# Patient Record
Sex: Female | Born: 1995 | Race: Black or African American | Hispanic: No | Marital: Single | Smoking: Former smoker
Health system: Southern US, Community
[De-identification: ages and names within clinical notes are randomized; demographics above are authoritative.]

## PROBLEM LIST (undated history)

## (undated) DIAGNOSIS — K219 Gastro-esophageal reflux disease without esophagitis: Secondary | ICD-10-CM

## (undated) DIAGNOSIS — R87629 Unspecified abnormal cytological findings in specimens from vagina: Secondary | ICD-10-CM

## (undated) DIAGNOSIS — J45909 Unspecified asthma, uncomplicated: Secondary | ICD-10-CM

## (undated) HISTORY — PX: NO PAST SURGERIES: SHX2092

---
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
## 2015-09-29 ENCOUNTER — Emergency Department (HOSPITAL_COMMUNITY)
Admission: EM | Admit: 2015-09-29 | Discharge: 2015-09-29 | Disposition: A | Discharge status: Home / Self Care | Payer: Self-pay | Attending: Emergency Medicine | Admitting: Emergency Medicine

## 2015-09-29 ENCOUNTER — Encounter (HOSPITAL_COMMUNITY): Payer: Self-pay | Admitting: Emergency Medicine

## 2015-09-29 DIAGNOSIS — R112 Nausea with vomiting, unspecified: Secondary | ICD-10-CM | POA: Insufficient documentation

## 2015-09-29 DIAGNOSIS — Z79899 Other long term (current) drug therapy: Secondary | ICD-10-CM | POA: Insufficient documentation

## 2015-09-29 DIAGNOSIS — R197 Diarrhea, unspecified: Secondary | ICD-10-CM | POA: Insufficient documentation

## 2015-09-29 DIAGNOSIS — Z3202 Encounter for pregnancy test, result negative: Secondary | ICD-10-CM | POA: Insufficient documentation

## 2015-09-29 DIAGNOSIS — R1084 Generalized abdominal pain: Secondary | ICD-10-CM | POA: Insufficient documentation

## 2015-09-29 LAB — I-STAT BETA HCG BLOOD, ED (MC, WL, AP ONLY): I-stat hCG, quantitative: <5 m[IU]/mL (ref <5)

## 2015-09-29 MED ORDER — ONDANSETRON HCL 4 MG/2ML IJ SOLN
4.0000 mg | Freq: Once | INTRAMUSCULAR | Status: AC | PRN
  Administered 2015-09-29: 4 mg via INTRAVENOUS
  Filled 2015-09-29: qty 2

## 2015-09-29 MED ORDER — DICYCLOMINE HCL 20 MG PO TABS: 20.0000 mg | ORAL_TABLET | Freq: Two times a day (BID) | ORAL | Status: DC

## 2015-09-29 MED ORDER — MORPHINE SULFATE (PF) 4 MG/ML IV SOLN
4.0000 mg | Freq: Once | INTRAVENOUS | Status: AC
  Administered 2015-09-29: 4 mg via INTRAVENOUS
  Filled 2015-09-29: qty 1

## 2015-09-29 MED ORDER — DIPHENHYDRAMINE HCL 50 MG/ML IJ SOLN
12.5000 mg | Freq: Once | INTRAMUSCULAR | Status: AC
  Administered 2015-09-29: 12.5 mg via INTRAVENOUS
  Filled 2015-09-29: qty 1

## 2015-09-29 MED ORDER — METOCLOPRAMIDE HCL 10 MG PO TABS: 10.0000 mg | ORAL_TABLET | Freq: Four times a day (QID) | ORAL | Status: DC

## 2015-09-29 MED ORDER — METOCLOPRAMIDE HCL 5 MG/ML IJ SOLN
10.0000 mg | Freq: Once | INTRAMUSCULAR | Status: AC
  Administered 2015-09-29: 10 mg via INTRAVENOUS
  Filled 2015-09-29: qty 2

## 2015-09-29 MED ORDER — SODIUM CHLORIDE 0.9 % IV BOLUS (SEPSIS)
1000.0000 mL | Freq: Once | INTRAVENOUS | Status: AC
  Administered 2015-09-29: 1000 mL via INTRAVENOUS

## 2015-09-29 NOTE — ED Notes (Signed)
Pt has emesis x 5 hours. Pt was given 4mg  of zofran in route. Pt has generalized abdominal pain and diarrhea.

## 2015-09-29 NOTE — Discharge Instructions (Signed)
RETURN TO THE EMERGENCY DEPARTMENT IF YOU HAVE SEVERE PAIN, HIGH FEVER, UNCONTROLLED VOMITING OR NEW CONCERN.  Nausea and Vomiting Nausea is a sick feeling that often comes before throwing up (vomiting). Vomiting is a reflex where stomach contents come out of your mouth. Vomiting can cause severe loss of body fluids (dehydration). Children and elderly adults can become dehydrated quickly, especially if they also have diarrhea. Nausea and vomiting are symptoms of a condition or disease. It is important to find the cause of your symptoms. CAUSES   Direct irritation of the stomach lining. This irritation can result from increased acid production (gastroesophageal reflux disease), infection, food poisoning, taking certain medicines (such as nonsteroidal anti-inflammatory drugs), alcohol use, or tobacco use.  Signals from the brain.These signals could be caused by a headache, heat exposure, an inner ear disturbance, increased pressure in the brain from injury, infection, a tumor, or a concussion, pain, emotional stimulus, or metabolic problems.  An obstruction in the gastrointestinal tract (bowel obstruction).  Illnesses such as diabetes, hepatitis, gallbladder problems, appendicitis, kidney problems, cancer, sepsis, atypical symptoms of a heart attack, or eating disorders.  Medical treatments such as chemotherapy and radiation.  Receiving medicine that makes you sleep (general anesthetic) during surgery. DIAGNOSIS Your caregiver may ask for tests to be done if the problems do not improve after a few days. Tests may also be done if symptoms are severe or if the reason for the nausea and vomiting is not clear. Tests may include:  Urine tests.  Blood tests.  Stool tests.  Cultures (to look for evidence of infection).  X-rays or other imaging studies. Test results can help your caregiver make decisions about treatment or the need for additional tests. TREATMENT You need to stay well hydrated.  Drink frequently but in small amounts.You may wish to drink water, sports drinks, clear broth, or eat frozen ice pops or gelatin dessert to help stay hydrated.When you eat, eating slowly may help prevent nausea.There are also some antinausea medicines that may help prevent nausea. HOME CARE INSTRUCTIONS   Take all medicine as directed by your caregiver.  If you do not have an appetite, do not force yourself to eat. However, you must continue to drink fluids.  If you have an appetite, eat a normal diet unless your caregiver tells you differently.  Eat a variety of complex carbohydrates (rice, wheat, potatoes, bread), lean meats, yogurt, fruits, and vegetables.  Avoid high-fat foods because they are more difficult to digest.  Drink enough water and fluids to keep your urine clear or pale yellow.  If you are dehydrated, ask your caregiver for specific rehydration instructions. Signs of dehydration may include:  Severe thirst.  Dry lips and mouth.  Dizziness.  Dark urine.  Decreasing urine frequency and amount.  Confusion.  Rapid breathing or pulse. SEEK IMMEDIATE MEDICAL CARE IF:   You have blood or brown flecks (like coffee grounds) in your vomit.  You have black or bloody stools.  You have a severe headache or stiff neck.  You are confused.  You have severe abdominal pain.  You have chest pain or trouble breathing.  You do not urinate at least once every 8 hours.  You develop cold or clammy skin.  You continue to vomit for longer than 24 to 48 hours.  You have a fever. MAKE SURE YOU:   Understand these instructions.  Will watch your condition.  Will get help right away if you are not doing well or get worse.  This information is not intended to replace advice given to you by your health care provider. Make sure you discuss any questions you have with your health care provider.   Document Released: 09/30/2005 Document Revised: 12/23/2011 Document  Reviewed: 02/27/2011 Elsevier Interactive Patient Education [employer] Inc[employer]

## 2015-09-29 NOTE — ED Notes (Signed)
Bed: [id number] Expected date:  Expected time:  Means of arrival:  Comments: EMS 19 yo female from home, nausea and vomiting for 5 hours, IV established, Zofran per EMS

## 2015-09-29 NOTE — ED Provider Notes (Signed)
CSN: [id number]     Arrival date & time 09/29/15  0309 History   First MD Initiated Contact with Patient 09/29/15 [phone]     Chief Complaint  Patient presents with  . Emesis     (Consider location/radiation/quality/duration/timing/severity/associated sxs/prior Treatment) Patient is a 19 y.o. female presenting with vomiting. The history is provided by the patient. No language interpreter was used.  Emesis Severity:  Moderate Duration:  5 hours Timing:  Constant Number of daily episodes:  TNTC Chronicity:  New Context: not post-tussive   Relieved by:  Nothing Associated symptoms: abdominal pain and diarrhea   Associated symptoms: no chills and no myalgias   Associated symptoms comment:  Patient started having nausea with vomiting and generalized abdominal pain earlier this evening while at work. No fever. She has had 2-3 loose stools. No hematemesis or bloody stool. No cough, dysuria, SOB. Patient works at [employer] and feels she has been exposed to others with similar symptoms.   History reviewed. No pertinent past medical history. History reviewed. No pertinent past surgical history. No family history on file. Social History  Substance Use Topics  . Smoking status: None  . Smokeless tobacco: None  . Alcohol Use: None   OB History    No data available     Review of Systems  Constitutional: Negative for fever and chills.  Respiratory: Negative.  Negative for shortness of breath.   Cardiovascular: Negative.  Negative for chest pain.  Gastrointestinal: Positive for nausea, vomiting, abdominal pain and diarrhea.  Genitourinary: Negative.   Musculoskeletal: Negative.  Negative for myalgias.  Skin: Negative.   Neurological: Negative.       Allergies  Review of patient's allergies indicates no known allergies.  Home Medications   Prior to Admission medications   Medication Sig Start Date End Date Taking? Authorizing Provider  budesonide-formoterol (SYMBICORT) 160-4.5  MCG/ACT inhaler Inhale 2 puffs into the lungs 2 (two) times daily.   Yes Historical Provider, MD  loperamide (IMODIUM) 2 MG capsule Take 2 mg by mouth as needed for diarrhea or loose stools.   Yes Historical Provider, MD   BP 125/77 mmHg  Pulse 75  Temp(Src) 98.3 F (36.8 C) (Oral)  Resp 16  Ht 5\' 8"  (1.727 m)  Wt 65.772 kg  BMI 22.05 kg/m2  SpO2 100%  LMP 08/26/2015 (Approximate) Physical Exam  Constitutional: She is oriented to person, place, and time. She appears well-developed and well-nourished.  HENT:  Head: Normocephalic.  Neck: Normal range of motion. Neck supple.  Cardiovascular: Normal rate and regular rhythm.   Pulmonary/Chest: Effort normal and breath sounds normal. She has no wheezes. She has no rales.  Abdominal: Soft. Bowel sounds are normal. There is tenderness. There is no rebound and no guarding.  Generalized tenderness to soft abdomen.  Musculoskeletal: Normal range of motion.  Neurological: She is alert and oriented to person, place, and time.  Skin: Skin is warm and dry.  Psychiatric: She has a normal mood and affect.    ED Course  Procedures (including critical care time) Labs Review Labs Reviewed - No data to display  Imaging Review No results found. I have personally reviewed and evaluated these images and lab results as part of my medical decision-making.   EKG Interpretation None     Results for orders placed or performed during the hospital encounter of 09/29/15  I-Stat beta hCG blood, ED  Result Value Ref Range   I-stat hCG, quantitative <5.0 <5 mIU/mL   Comment 3  MDM   Final diagnoses:  None    1. Nausea and vomiting  Patient has been exposed to similar illness of N, V in work community. She is healthy otherwise, with normal VS and short duration of symptoms. Abdominal pain is diffuse, better with medications. Vomiting controlled, tolerating PO fluids. Return precautions discussed. She is felt stable for discharge home.      Elpidio Anis, PA-C 09/29/15 2258  Derwood Kaplan, MD 09/30/15 2209

## 2016-01-31 ENCOUNTER — Emergency Department (HOSPITAL_COMMUNITY)
Admission: EM | Admit: 2016-01-31 | Discharge: 2016-01-31 | Disposition: A | Discharge status: Home / Self Care | Payer: Self-pay | Attending: Emergency Medicine | Admitting: Emergency Medicine

## 2016-01-31 DIAGNOSIS — Z3202 Encounter for pregnancy test, result negative: Secondary | ICD-10-CM | POA: Insufficient documentation

## 2016-01-31 DIAGNOSIS — K2920 Alcoholic gastritis without bleeding: Secondary | ICD-10-CM | POA: Insufficient documentation

## 2016-01-31 DIAGNOSIS — Z79899 Other long term (current) drug therapy: Secondary | ICD-10-CM | POA: Insufficient documentation

## 2016-01-31 LAB — CBC WITH DIFFERENTIAL/PLATELET
Basophils Absolute: 0 10*3/uL (ref 0.0–0.1)
Basophils Relative: 0 %
Eosinophils Absolute: 0 10*3/uL (ref 0.0–0.7)
Eosinophils Relative: 1 %
HEMATOCRIT: 38.6 % (ref 36.0–46.0)
HEMOGLOBIN: 13.4 g/dL (ref 12.0–15.0)
LYMPHS ABS: 1.5 10*3/uL (ref 0.7–4.0)
LYMPHS PCT: 28 %
MCH: 29.8 pg (ref 26.0–34.0)
MCHC: 34.7 g/dL (ref 30.0–36.0)
MCV: 86 fL (ref 78.0–100.0)
MONOS PCT: 8 %
Monocytes Absolute: 0.4 10*3/uL (ref 0.1–1.0)
NEUTROS ABS: 3.4 10*3/uL (ref 1.7–7.7)
NEUTROS PCT: 63 %
Platelets: 311 10*3/uL (ref 150–400)
RBC: 4.49 MIL/uL (ref 3.87–5.11)
RDW: 13.3 % (ref 11.5–15.5)
WBC: 5.4 10*3/uL (ref 4.0–10.5)

## 2016-01-31 LAB — COMPREHENSIVE METABOLIC PANEL
ALK PHOS: 55 U/L (ref 38–126)
ALT: 68 U/L — ABNORMAL HIGH (ref 14–54)
ANION GAP: 15 (ref 5–15)
AST: 41 U/L (ref 15–41)
Albumin: 4.8 g/dL (ref 3.5–5.0)
BILIRUBIN TOTAL: 0.4 mg/dL (ref 0.3–1.2)
BUN: 13 mg/dL (ref 6–20)
CALCIUM: 9.8 mg/dL (ref 8.9–10.3)
CO2: 18 mmol/L — ABNORMAL LOW (ref 22–32)
CREATININE: 0.66 mg/dL (ref 0.44–1.00)
Chloride: 109 mmol/L (ref 101–111)
GFR calc non Af Amer: >60 mL/min (ref >60)
Glucose, Bld: 114 mg/dL — ABNORMAL HIGH (ref 65–99)
Potassium: 3.5 mmol/L (ref 3.5–5.1)
Sodium: 142 mmol/L (ref 135–145)
TOTAL PROTEIN: 8.3 g/dL — AB (ref 6.5–8.1)

## 2016-01-31 LAB — URINALYSIS, ROUTINE W REFLEX MICROSCOPIC
Bilirubin Urine: NEGATIVE
GLUCOSE, UA: NEGATIVE mg/dL
KETONES UR: NEGATIVE mg/dL
Leukocytes, UA: NEGATIVE
Nitrite: NEGATIVE
PH: 7 (ref 5.0–8.0)
PROTEIN: NEGATIVE mg/dL
Specific Gravity, Urine: 1.022 (ref 1.005–1.030)

## 2016-01-31 LAB — I-STAT BETA HCG BLOOD, ED (MC, WL, AP ONLY)

## 2016-01-31 LAB — URINE MICROSCOPIC-ADD ON
BACTERIA UA: NONE SEEN
WBC, UA: NONE SEEN WBC/hpf (ref 0–5)

## 2016-01-31 LAB — LIPASE, BLOOD: Lipase: 19 U/L (ref 11–51)

## 2016-01-31 MED ORDER — SODIUM CHLORIDE 0.9 % IV SOLN: INTRAVENOUS | Status: DC

## 2016-01-31 MED ORDER — SODIUM CHLORIDE 0.9 % IV BOLUS (SEPSIS)
2000.0000 mL | Freq: Once | INTRAVENOUS | Status: AC
  Administered 2016-01-31: 2000 mL via INTRAVENOUS

## 2016-01-31 MED ORDER — SUCRALFATE 1 G PO TABS: 1.0000 g | ORAL_TABLET | Freq: Four times a day (QID) | ORAL | Status: DC

## 2016-01-31 MED ORDER — PANTOPRAZOLE SODIUM 40 MG IV SOLR
40.0000 mg | Freq: Once | INTRAVENOUS | Status: AC
  Administered 2016-01-31: 40 mg via INTRAVENOUS
  Filled 2016-01-31: qty 40

## 2016-01-31 MED ORDER — MORPHINE SULFATE (PF) 4 MG/ML IV SOLN
4.0000 mg | Freq: Once | INTRAVENOUS | Status: AC
  Administered 2016-01-31: 4 mg via INTRAVENOUS
  Filled 2016-01-31: qty 1

## 2016-01-31 MED ORDER — ONDANSETRON HCL 4 MG/2ML IJ SOLN
4.0000 mg | Freq: Once | INTRAMUSCULAR | Status: AC
  Administered 2016-01-31: 4 mg via INTRAVENOUS
  Filled 2016-01-31: qty 2

## 2016-01-31 MED ORDER — FAMOTIDINE 20 MG PO TABS: 20.0000 mg | ORAL_TABLET | Freq: Two times a day (BID) | ORAL | Status: DC

## 2016-01-31 NOTE — Discharge Instructions (Signed)

## 2016-01-31 NOTE — ED Notes (Signed)
Bed: AV40WA10 Expected date:  Expected time:  Means of arrival:  Comments: Ems-emesis

## 2016-01-31 NOTE — ED Notes (Signed)
Pt made aware of need for urine specimen. Pt states she is unable to walk at this time.

## 2016-01-31 NOTE — ED Notes (Signed)
Patient drank several shots of alcohol last night. She is not sure how much she had, but she has been having nausea and emesis every since. Patient states she usually feels better after she vomits, but she has continued to vomit.

## 2016-01-31 NOTE — ED Notes (Signed)
MD at bedside. 

## 2016-01-31 NOTE — ED Provider Notes (Signed)
CSN: [id number]     Arrival date & time 01/31/16  1116 History   First MD Initiated Contact with Patient 01/31/16 1135     Chief Complaint  Patient presents with  . Emesis  . Abdominal Pain     (Consider location/radiation/quality/duration/timing/severity/associated sxs/prior Treatment) Patient is a 20 y.o. female presenting with vomiting and abdominal pain. The history is provided by the patient.  Emesis Severity:  Moderate Duration:  12 hours Timing:  Constant Quality:  Stomach contents How soon after eating does vomiting occur:  1 minute Progression:  Worsening Chronicity:  New Recent urination:  Normal Context: not self-induced   Relieved by:  Nothing Worsened by:  Nothing tried Ineffective treatments:  None tried Associated symptoms: abdominal pain   Associated symptoms: no fever   Risk factors: alcohol use   Risk factors: not pregnant now and no prior abdominal surgery   Abdominal Pain Associated symptoms: vomiting     No past medical history on file. No past surgical history on file. No family history on file. Social History  Substance Use Topics  . Smoking status: Not on file  . Smokeless tobacco: Not on file  . Alcohol Use: Not on file   OB History    No data available     Review of Systems  Gastrointestinal: Positive for vomiting and abdominal pain.  All other systems reviewed and are negative.     Allergies  Review of patient's allergies indicates no known allergies.  Home Medications   Prior to Admission medications   Medication Sig Start Date End Date Taking? Authorizing Provider  budesonide-formoterol (SYMBICORT) 160-4.5 MCG/ACT inhaler Inhale 2 puffs into the lungs 2 (two) times daily.    Historical Provider, MD  dicyclomine (BENTYL) 20 MG tablet Take 1 tablet (20 mg total) by mouth 2 (two) times daily. 09/29/15   Elpidio Anis, PA-C  loperamide (IMODIUM) 2 MG capsule Take 2 mg by mouth as needed for diarrhea or loose stools.    Historical  Provider, MD  metoCLOPramide (REGLAN) 10 MG tablet Take 1 tablet (10 mg total) by mouth every 6 (six) hours. 09/29/15   Elpidio Anis, PA-C   There were no vitals taken for this visit. Physical Exam  Constitutional: She is oriented to person, place, and time. She appears well-developed and well-nourished.  Non-toxic appearance. No distress.  HENT:  Head: Normocephalic and atraumatic.  Eyes: Conjunctivae, EOM and lids are normal. Pupils are equal, round, and reactive to light.  Neck: Normal range of motion. Neck supple. No tracheal deviation present. No thyroid mass present.  Cardiovascular: Normal rate, regular rhythm and normal heart sounds.  Exam reveals no gallop.   No murmur heard. Pulmonary/Chest: Effort normal and breath sounds normal. No stridor. No respiratory distress. She has no decreased breath sounds. She has no wheezes. She has no rhonchi. She has no rales.  Abdominal: Soft. Normal appearance and bowel sounds are normal. She exhibits no distension. There is generalized tenderness. There is no rigidity, no rebound, no guarding and no CVA tenderness.  Musculoskeletal: Normal range of motion. She exhibits no edema or tenderness.  Neurological: She is alert and oriented to person, place, and time. She has normal strength. No cranial nerve deficit or sensory deficit. GCS eye subscore is 4. GCS verbal subscore is 5. GCS motor subscore is 6.  Skin: Skin is warm and dry. No abrasion and no rash noted.  Psychiatric: She has a normal mood and affect. Her speech is normal and behavior is normal.  Nursing note and vitals reviewed.   ED Course  Procedures (including critical care time) Labs Review Labs Reviewed  URINE CULTURE  CBC WITH DIFFERENTIAL/PLATELET  COMPREHENSIVE METABOLIC PANEL  LIPASE, BLOOD  URINALYSIS, ROUTINE W REFLEX MICROSCOPIC (NOT AT [facility])  I-STAT BETA HCG BLOOD, ED ([facility], [facility], [facility] ONLY)    Imaging Review No results found. I have personally reviewed and evaluated  these images and lab results as part of my medical decision-making.   EKG Interpretation None      MDM   Final diagnoses:  None    Patient is that she consumed copious amount of alcohol on empty stomach. Treated with medications and feels better. Stable for discharge and encouraged to cease drinking alcohol    Lorre NickAnthony Kinzley Savell, MD 01/31/16 1347

## 2016-02-02 LAB — URINE CULTURE

## 2017-12-25 ENCOUNTER — Encounter (HOSPITAL_COMMUNITY): Payer: Self-pay | Admitting: Emergency Medicine

## 2017-12-25 ENCOUNTER — Other Ambulatory Visit: Payer: Self-pay

## 2017-12-25 ENCOUNTER — Emergency Department (HOSPITAL_COMMUNITY)
Admission: EM | Admit: 2017-12-25 | Discharge: 2017-12-25 | Disposition: A | Discharge status: Home / Self Care | Payer: PRIVATE HEALTH INSURANCE | Attending: Emergency Medicine | Admitting: Emergency Medicine

## 2017-12-25 DIAGNOSIS — R112 Nausea with vomiting, unspecified: Secondary | ICD-10-CM | POA: Insufficient documentation

## 2017-12-25 DIAGNOSIS — Z5321 Procedure and treatment not carried out due to patient leaving prior to being seen by health care provider: Secondary | ICD-10-CM | POA: Insufficient documentation

## 2017-12-25 HISTORY — DX: Unspecified asthma, uncomplicated: J45.909

## 2017-12-25 LAB — URINALYSIS, ROUTINE W REFLEX MICROSCOPIC
BILIRUBIN URINE: NEGATIVE
Bacteria, UA: NONE SEEN
GLUCOSE, UA: NEGATIVE mg/dL
HGB URINE DIPSTICK: NEGATIVE
KETONES UR: 80 mg/dL — AB
LEUKOCYTES UA: NEGATIVE
Nitrite: NEGATIVE
Protein, ur: 30 mg/dL — AB
Specific Gravity, Urine: 1.025 (ref 1.005–1.030)
pH: 9 — ABNORMAL HIGH (ref 5.0–8.0)

## 2017-12-25 LAB — POC URINE PREG, ED: PREG TEST UR: NEGATIVE

## 2017-12-25 NOTE — ED Notes (Signed)
Called Pt in lobby no response in lobby x2. 

## 2017-12-25 NOTE — ED Notes (Signed)
PT called to be roomed no response in lobby x1.

## 2017-12-25 NOTE — ED Triage Notes (Signed)
Pt reports recurrent abdominal cramping, intermittent nausea, vomiting x 2 over last 5 days. Pt reports severe cramping every month with her menstrual cycle. Pt does not have PCP. Received BCP from health department one year ago.

## 2017-12-25 NOTE — ED Notes (Signed)
Called pt in lobby to be roomed, no response in lobby x3.

## 2019-02-26 ENCOUNTER — Encounter (HOSPITAL_COMMUNITY): Payer: Self-pay | Admitting: Emergency Medicine

## 2019-02-26 ENCOUNTER — Emergency Department (HOSPITAL_COMMUNITY)
Admission: EM | Admit: 2019-02-26 | Discharge: 2019-02-27 | Disposition: A | Discharge status: Home / Self Care | Payer: PRIVATE HEALTH INSURANCE | Attending: Emergency Medicine | Admitting: Emergency Medicine

## 2019-02-26 ENCOUNTER — Other Ambulatory Visit: Payer: Self-pay

## 2019-02-26 DIAGNOSIS — J45909 Unspecified asthma, uncomplicated: Secondary | ICD-10-CM | POA: Insufficient documentation

## 2019-02-26 DIAGNOSIS — N946 Dysmenorrhea, unspecified: Secondary | ICD-10-CM | POA: Insufficient documentation

## 2019-02-26 NOTE — ED Triage Notes (Signed)
C/o vomiting x 4 hours and lower abd cramping that she believes is related to her menstrual cycle.  LMP started yesterday.  Denies diarrhea.

## 2019-02-26 NOTE — ED Notes (Signed)
Pt unable to void at this time. 

## 2019-02-26 NOTE — ED Notes (Signed)
Pt's dad, Jalyse Meersman- [phone]

## 2019-02-27 LAB — I-STAT BETA HCG BLOOD, ED (MC, WL, AP ONLY): I-stat hCG, quantitative: <5 m[IU]/mL (ref <5)

## 2019-02-27 LAB — COMPREHENSIVE METABOLIC PANEL
ALT: 14 U/L (ref 0–44)
AST: 17 U/L (ref 15–41)
Albumin: 4.2 g/dL (ref 3.5–5.0)
Alkaline Phosphatase: 51 U/L (ref 38–126)
Anion gap: 11 (ref 5–15)
BUN: 11 mg/dL (ref 6–20)
CO2: 20 mmol/L — ABNORMAL LOW (ref 22–32)
Calcium: 9.4 mg/dL (ref 8.9–10.3)
Chloride: 107 mmol/L (ref 98–111)
Creatinine, Ser: 0.7 mg/dL (ref 0.44–1.00)
GFR calc Af Amer: >60 mL/min (ref >60)
GFR calc non Af Amer: >60 mL/min (ref >60)
Glucose, Bld: 118 mg/dL — ABNORMAL HIGH (ref 70–99)
Potassium: 3.5 mmol/L (ref 3.5–5.1)
Sodium: 138 mmol/L (ref 135–145)
Total Bilirubin: 0.5 mg/dL (ref 0.3–1.2)
Total Protein: 6.9 g/dL (ref 6.5–8.1)

## 2019-02-27 LAB — URINALYSIS, ROUTINE W REFLEX MICROSCOPIC
Bacteria, UA: NONE SEEN
Bilirubin Urine: NEGATIVE
Glucose, UA: NEGATIVE mg/dL
Ketones, ur: 20 mg/dL — AB
Leukocytes,Ua: NEGATIVE
Nitrite: NEGATIVE
Protein, ur: NEGATIVE mg/dL
Specific Gravity, Urine: 1.026 (ref 1.005–1.030)
pH: 7 (ref 5.0–8.0)

## 2019-02-27 LAB — CBC
HCT: 38.6 % (ref 36.0–46.0)
Hemoglobin: 13.2 g/dL (ref 12.0–15.0)
MCH: 30.8 pg (ref 26.0–34.0)
MCHC: 34.2 g/dL (ref 30.0–36.0)
MCV: 90.2 fL (ref 80.0–100.0)
Platelets: 248 10*3/uL (ref 150–400)
RBC: 4.28 MIL/uL (ref 3.87–5.11)
RDW: 13.3 % (ref 11.5–15.5)
WBC: 8.8 10*3/uL (ref 4.0–10.5)
nRBC: 0 % (ref 0.0–0.2)

## 2019-02-27 LAB — LIPASE, BLOOD: Lipase: 25 U/L (ref 11–51)

## 2019-02-27 MED ORDER — PROMETHAZINE HCL 25 MG PO TABS: 25.0000 mg | ORAL_TABLET | Freq: Four times a day (QID) | ORAL | 0 refills | Status: DC | PRN

## 2019-02-27 MED ORDER — NAPROXEN 500 MG PO TABS: 500.0000 mg | ORAL_TABLET | Freq: Two times a day (BID) | ORAL | 0 refills | Status: DC | PRN

## 2019-02-27 MED ORDER — SODIUM CHLORIDE 0.9 % IV BOLUS
1000.0000 mL | Freq: Once | INTRAVENOUS | Status: AC
  Administered 2019-02-27: 02:00:00 1000 mL via INTRAVENOUS

## 2019-02-27 MED ORDER — ONDANSETRON HCL 4 MG/2ML IJ SOLN
4.0000 mg | Freq: Once | INTRAMUSCULAR | Status: AC
  Administered 2019-02-27: 4 mg via INTRAVENOUS
  Filled 2019-02-27: qty 2

## 2019-02-27 MED ORDER — PROMETHAZINE HCL 25 MG/ML IJ SOLN: 12.5000 mg | Freq: Once | INTRAMUSCULAR | Status: DC | PRN

## 2019-02-27 MED ORDER — KETOROLAC TROMETHAMINE 30 MG/ML IJ SOLN
30.0000 mg | Freq: Once | INTRAMUSCULAR | Status: AC
  Administered 2019-02-27: 02:00:00 30 mg via INTRAVENOUS
  Filled 2019-02-27: qty 1

## 2019-02-27 NOTE — ED Provider Notes (Signed)
[facility] Provider Note   CSN: [id number] Arrival date & time: 02/26/19  2341    History   Chief Complaint Chief Complaint  Patient presents with  . Emesis  . Abdominal Pain    HPI Katrina Cain is a 23 y.o. female.    23 year old G0P0 female presents to the emergency department for evaluation of dysmenorrhea.  She reports a history of bad abdominal cramps associated with the first and second day of her menses.  Her menstrual cycle began yesterday and she has been having ongoing cramping.  She does not take any medication for her symptoms because it has never helped her in the past.  She denies any abnormal vaginal discharge or abnormal flow to her menses.  She has nausea and vomiting on occasion with her menstrual cramping, but her vomiting became more persistent and unrelieved tonight prompting her visit to the ED.  She had not had any fevers, diarrhea, history of abdominal surgeries.  The history is provided by the patient. No language interpreter was used.  Emesis  Associated symptoms: abdominal pain   Abdominal Pain  Associated symptoms: vomiting     Past Medical History:  Diagnosis Date  . Asthma     There are no active problems to display for this patient.   History reviewed. No pertinent surgical history.   OB History   No obstetric history on file.      Home Medications    Prior to Admission medications   Medication Sig Start Date End Date Taking? Authorizing Provider  naproxen (NAPROSYN) 500 MG tablet Take 1 tablet (500 mg total) by mouth every 12 (twelve) hours as needed for mild pain or moderate pain. 02/27/19   Antony MaduraHumes, Darin Redmann, PA-C  promethazine (PHENERGAN) 25 MG tablet Take 1 tablet (25 mg total) by mouth every 6 (six) hours as needed for nausea or vomiting. 02/27/19   Antony MaduraHumes, Cornell Bourbon, PA-C    Family History Family History  Problem Relation Age of Onset  . Diabetes Mother   . Hypertension Mother   . Cancer  Father     Social History Social History   Tobacco Use  . Smoking status: Never Smoker  . Smokeless tobacco: Never Used  Substance Use Topics  . Alcohol use: No    Frequency: Never  . Drug use: Not Currently     Allergies   Patient has no known allergies.   Review of Systems Review of Systems  Gastrointestinal: Positive for abdominal pain and vomiting.  Ten systems reviewed and are negative for acute change, except as noted in the HPI.    Physical Exam Updated Vital Signs BP 106/70   Pulse 80   Temp 97.9 F (36.6 C) (Oral)   Resp (!) 30   LMP 02/25/2019   SpO2 100%   Physical Exam Vitals signs and nursing note reviewed.  Constitutional:      General: She is not in acute distress.    Appearance: She is well-developed. She is not diaphoretic.     Comments: Tearful   HENT:     Head: Normocephalic and atraumatic.  Eyes:     General: No scleral icterus.    Conjunctiva/sclera: Conjunctivae normal.  Neck:     Musculoskeletal: Normal range of motion.  Pulmonary:     Effort: Pulmonary effort is normal. No respiratory distress.     Comments: Respirations even and unlabored Abdominal:     Palpations: There is no mass.     Tenderness: There  is abdominal tenderness. There is guarding (voluntary).     Comments: Abdomen nondistended and generally tender.  Tenderness is subjectively worse in bilateral lower quadrants with voluntary guarding.  No palpable masses or peritoneal signs.  Musculoskeletal: Normal range of motion.  Skin:    General: Skin is warm and dry.     Coloration: Skin is not pale.     Findings: No erythema or rash.  Neurological:     Mental Status: She is alert and oriented to person, place, and time.  Psychiatric:        Behavior: Behavior normal.      ED Treatments / Results  Labs (all labs ordered are listed, but only abnormal results are displayed) Labs Reviewed  COMPREHENSIVE METABOLIC PANEL - Abnormal; Notable for the following  components:      Result Value   CO2 20 (*)    Glucose, Bld 118 (*)    All other components within normal limits  URINALYSIS, ROUTINE W REFLEX MICROSCOPIC - Abnormal; Notable for the following components:   Hgb urine dipstick SMALL (*)    Ketones, ur 20 (*)    All other components within normal limits  LIPASE, BLOOD  CBC  I-STAT BETA HCG BLOOD, ED ([facility], [facility], [facility] ONLY)    EKG None  Radiology No results found.  Procedures Procedures (including critical care time)  Medications Ordered in ED Medications  promethazine (PHENERGAN) injection 12.5 mg (has no administration in time range)  ketorolac (TORADOL) 30 MG/ML injection 30 mg (30 mg Intravenous Given 02/27/19 0202)  ondansetron (ZOFRAN) injection 4 mg (4 mg Intravenous Given 02/27/19 0202)  sodium chloride 0.9 % bolus 1,000 mL (0 mLs Intravenous Stopped 02/27/19 0328)    3:29 AM Patient sleeping on repeat assessment. Upon waking, pain has subsided; currently reporting no pain. Will PO challenge. If tolerating fluids plan for d/c.  4:10 AM Patient reassessed.  Continues to have no pain.  She is tolerating oral fluids without vomiting.   Initial Impression / Assessment and Plan / ED Course  I have reviewed the triage vital signs and the nursing notes.  Pertinent labs & imaging results that were available during my care of the patient were reviewed by me and considered in my medical decision making (see chart for details).        23 year old female presents to the emergency department for abdominal cramping, nausea, vomiting.  She has a history of similar symptoms associated with her menstrual cycle.  Her menses began yesterday.  No medications taken prior to arrival.  Noted to have significant symptomatic improvement following Toradol, Zofran, IV fluids.  Symptoms consistent with uncomplicated dysmenorrhea.  She has been advised to follow-up with her primary care doctor/OB/GYN.  Will discharge with antiemetics and prescription  for naproxen. Return precautions discussed and provided. Patient discharged in stable condition with no unaddressed concerns.   Final Clinical Impressions(s) / ED Diagnoses   Final diagnoses:  Dysmenorrhea    ED Discharge Orders         Ordered    promethazine (PHENERGAN) 25 MG tablet  Every 6 hours PRN     02/27/19 0410    naproxen (NAPROSYN) 500 MG tablet  Every 12 hours PRN     02/27/19 0410           Antony Madura, PA-C 02/27/19 0434    Nira Conn, MD 02/27/19 [phone]

## 2019-02-27 NOTE — ED Notes (Signed)
Pt tolerating fluids well when taking small sips. No n/v experienced.

## 2019-02-27 NOTE — ED Notes (Signed)
Patient states she is unable to void at this time.  

## 2020-03-30 ENCOUNTER — Encounter (HOSPITAL_COMMUNITY): Payer: Self-pay

## 2020-03-30 ENCOUNTER — Other Ambulatory Visit: Payer: Self-pay

## 2020-03-30 ENCOUNTER — Emergency Department (HOSPITAL_COMMUNITY)
Admission: EM | Admit: 2020-03-30 | Discharge: 2020-03-30 | Disposition: A | Discharge status: Home / Self Care | Payer: PRIVATE HEALTH INSURANCE | Attending: Emergency Medicine | Admitting: Emergency Medicine

## 2020-03-30 DIAGNOSIS — N946 Dysmenorrhea, unspecified: Secondary | ICD-10-CM | POA: Insufficient documentation

## 2020-03-30 DIAGNOSIS — Z79899 Other long term (current) drug therapy: Secondary | ICD-10-CM | POA: Insufficient documentation

## 2020-03-30 DIAGNOSIS — R112 Nausea with vomiting, unspecified: Secondary | ICD-10-CM

## 2020-03-30 DIAGNOSIS — J45909 Unspecified asthma, uncomplicated: Secondary | ICD-10-CM | POA: Insufficient documentation

## 2020-03-30 LAB — COMPREHENSIVE METABOLIC PANEL
ALT: 13 U/L (ref 0–44)
AST: 17 U/L (ref 15–41)
Albumin: 4.3 g/dL (ref 3.5–5.0)
Alkaline Phosphatase: 46 U/L (ref 38–126)
Anion gap: 6 (ref 5–15)
BUN: 15 mg/dL (ref 6–20)
CO2: 24 mmol/L (ref 22–32)
Calcium: 9 mg/dL (ref 8.9–10.3)
Chloride: 109 mmol/L (ref 98–111)
Creatinine, Ser: 0.67 mg/dL (ref 0.44–1.00)
GFR calc Af Amer: >60 mL/min (ref >60)
GFR calc non Af Amer: >60 mL/min (ref >60)
Glucose, Bld: 87 mg/dL (ref 70–99)
Potassium: 4.1 mmol/L (ref 3.5–5.1)
Sodium: 139 mmol/L (ref 135–145)
Total Bilirubin: 0.5 mg/dL (ref 0.3–1.2)
Total Protein: 7.3 g/dL (ref 6.5–8.1)

## 2020-03-30 LAB — URINALYSIS, ROUTINE W REFLEX MICROSCOPIC
Bilirubin Urine: NEGATIVE
Glucose, UA: NEGATIVE mg/dL
Ketones, ur: NEGATIVE mg/dL
Leukocytes,Ua: NEGATIVE
Nitrite: NEGATIVE
Protein, ur: NEGATIVE mg/dL
Specific Gravity, Urine: 1.034 — ABNORMAL HIGH (ref 1.005–1.030)
pH: 6 (ref 5.0–8.0)

## 2020-03-30 LAB — CBC
HCT: 39.4 % (ref 36.0–46.0)
Hemoglobin: 13.1 g/dL (ref 12.0–15.0)
MCH: 31.1 pg (ref 26.0–34.0)
MCHC: 33.2 g/dL (ref 30.0–36.0)
MCV: 93.6 fL (ref 80.0–100.0)
Platelets: 272 10*3/uL (ref 150–400)
RBC: 4.21 MIL/uL (ref 3.87–5.11)
RDW: 13.7 % (ref 11.5–15.5)
WBC: 5.4 10*3/uL (ref 4.0–10.5)
nRBC: 0 % (ref 0.0–0.2)

## 2020-03-30 LAB — I-STAT BETA HCG BLOOD, ED (MC, WL, AP ONLY): I-stat hCG, quantitative: <5 m[IU]/mL (ref <5)

## 2020-03-30 LAB — LIPASE, BLOOD: Lipase: 26 U/L (ref 11–51)

## 2020-03-30 MED ORDER — KETOROLAC TROMETHAMINE 30 MG/ML IJ SOLN
30.0000 mg | Freq: Once | INTRAMUSCULAR | Status: AC
  Administered 2020-03-30: 30 mg via INTRAMUSCULAR
  Filled 2020-03-30: qty 1

## 2020-03-30 MED ORDER — ONDANSETRON 4 MG PO TBDP
4.0000 mg | ORAL_TABLET | Freq: Once | ORAL | Status: AC
  Administered 2020-03-30: 4 mg via ORAL
  Filled 2020-03-30: qty 1

## 2020-03-30 MED ORDER — ONDANSETRON 4 MG PO TBDP: 4.0000 mg | ORAL_TABLET | Freq: Three times a day (TID) | ORAL | 0 refills | Status: DC | PRN

## 2020-03-30 MED ORDER — NAPROXEN 500 MG PO TABS: 500.0000 mg | ORAL_TABLET | Freq: Two times a day (BID) | ORAL | 0 refills | Status: DC | PRN

## 2020-03-30 NOTE — ED Notes (Signed)
Beta result <5.0 IU/L

## 2020-03-30 NOTE — ED Triage Notes (Signed)
Pt states irregular menstrual cycle. Presents to ER for uncontrolled pains and vomiting. States vomiting all day.

## 2020-03-30 NOTE — ED Provider Notes (Signed)
[facility]-EMERGENCY DEPT Provider Note   CSN: [id number] Arrival date & time: 03/30/20  1929     History Chief Complaint  Patient presents with  . Emesis    Katrina Cain is a 24 y.o. female.  Patient presents to the emergency department with a chief complaint of menstrual cramps.  She states that she has had her typical menstrual pain that started yesterday with bleeding that is normal.  She states that today she has also had persistent vomiting.  She states that this is not unusual for her.  She states that she usually follows up with the health department for the symptoms.  She has not been seen by an OB/GYN.  She states that since arriving in the emergency department her vomiting has stopped without treatment.  She denies any fevers or chills.   The history is provided by the patient. No language interpreter was used.       Past Medical History:  Diagnosis Date  . Asthma     There are no problems to display for this patient.   History reviewed. No pertinent surgical history.   OB History   No obstetric history on file.     Family History  Problem Relation Age of Onset  . Diabetes Mother   . Hypertension Mother   . Cancer Father     Social History   Tobacco Use  . Smoking status: Never Smoker  . Smokeless tobacco: Never Used  Substance Use Topics  . Alcohol use: No  . Drug use: Not Currently    Home Medications Prior to Admission medications   Medication Sig Start Date End Date Taking? Authorizing Provider  naproxen (NAPROSYN) 500 MG tablet Take 1 tablet (500 mg total) by mouth every 12 (twelve) hours as needed for mild pain or moderate pain. 03/30/20   Roxy Horseman, PA-C  ondansetron (ZOFRAN ODT) 4 MG disintegrating tablet Take 1 tablet (4 mg total) by mouth every 8 (eight) hours as needed for nausea or vomiting. 03/30/20   Roxy Horseman, PA-C  promethazine (PHENERGAN) 25 MG tablet Take 1 tablet (25 mg total) by mouth  every 6 (six) hours as needed for nausea or vomiting. 02/27/19 03/30/20  Antony Madura, PA-C    Allergies    Patient has no known allergies.  Review of Systems   Review of Systems  All other systems reviewed and are negative.   Physical Exam Updated Vital Signs BP 115/77 (BP Location: Left Arm)   Pulse 68   Temp 97.7 F (36.5 C) (Oral)   Resp 17   Ht 5\' 7"  (1.702 m)   Wt 70.3 kg   SpO2 100%   BMI 24.28 kg/m   Physical Exam Vitals and nursing note reviewed.  Constitutional:      General: She is not in acute distress.    Appearance: She is well-developed.  HENT:     Head: Normocephalic and atraumatic.  Eyes:     Conjunctiva/sclera: Conjunctivae normal.  Cardiovascular:     Rate and Rhythm: Normal rate and regular rhythm.     Heart sounds: No murmur heard.   Pulmonary:     Effort: Pulmonary effort is normal. No respiratory distress.     Breath sounds: Normal breath sounds.  Abdominal:     Palpations: Abdomen is soft.     Tenderness: There is no abdominal tenderness.     Comments: Mild lower abdominal discomfort, no focal tenderness  Musculoskeletal:     Cervical back: Neck supple.  Skin:    General: Skin is warm and dry.  Neurological:     Mental Status: She is alert.  Psychiatric:        Mood and Affect: Mood normal.        Behavior: Behavior normal.     ED Results / Procedures / Treatments   Labs (all labs ordered are listed, but only abnormal results are displayed) Labs Reviewed  URINALYSIS, ROUTINE W REFLEX MICROSCOPIC - Abnormal; Notable for the following components:      Result Value   Specific Gravity, Urine 1.034 (*)    Hgb urine dipstick SMALL (*)    Bacteria, UA RARE (*)    All other components within normal limits  LIPASE, BLOOD  COMPREHENSIVE METABOLIC PANEL  CBC  I-STAT BETA HCG BLOOD, ED ([facility], [facility], [facility] ONLY)    EKG None  Radiology No results found.  Procedures Procedures (including critical care time)  Medications Ordered in  ED Medications  ondansetron (ZOFRAN-ODT) disintegrating tablet 4 mg (has no administration in time range)  ketorolac (TORADOL) 30 MG/ML injection 30 mg (has no administration in time range)    ED Course  I have reviewed the triage vital signs and the nursing notes.  Pertinent labs & imaging results that were available during my care of the patient were reviewed by me and considered in my medical decision making (see chart for details).    MDM Rules/Calculators/A&P                          This patient complains of pelvic pain, this involves an extensive number of treatment options, and is a complaint that carries with it a high risk of complications and morbidity.  The differential diagnosis includes ectopic pregnancy, TOA, torsion, dysmenorrhea.  Pertinent Labs I ordered, reviewed, and interpreted labs, which included HCG which is negative, normal HGB, electrolytes are normal, no evidence of infection on UA.  Medications I ordered medication toradol and zofran for pain and nausea.  Sources Previous records obtained and reviewed; she has been seen for similar.   Reassessments After the interventions stated above, I reevaluated the patient and found improved and stable for discharge.  Final Clinical Impression(s) / ED Diagnoses Final diagnoses:  Menstrual cramps  Non-intractable vomiting with nausea, unspecified vomiting type    Rx / DC Orders ED Discharge Orders         Ordered    naproxen (NAPROSYN) 500 MG tablet  Every 12 hours PRN     Discontinue  Reprint     03/30/20 2248    ondansetron (ZOFRAN ODT) 4 MG disintegrating tablet  Every 8 hours PRN     Discontinue  Reprint     03/30/20 2248           Montine Circle, PA-C 03/30/20 2253    Drenda Freeze, MD 03/30/20 2321

## 2020-05-16 DIAGNOSIS — Z20822 Contact with and (suspected) exposure to covid-19: Secondary | ICD-10-CM | POA: Diagnosis not present

## 2020-08-14 ENCOUNTER — Emergency Department (HOSPITAL_COMMUNITY)
Admission: EM | Admit: 2020-08-14 | Discharge: 2020-08-15 | Disposition: A | Discharge status: Home / Self Care | Payer: Medicaid Other | Attending: Emergency Medicine | Admitting: Emergency Medicine

## 2020-08-14 ENCOUNTER — Other Ambulatory Visit: Payer: Self-pay

## 2020-08-14 ENCOUNTER — Encounter (HOSPITAL_COMMUNITY): Payer: Self-pay | Admitting: Emergency Medicine

## 2020-08-14 DIAGNOSIS — Z5321 Procedure and treatment not carried out due to patient leaving prior to being seen by health care provider: Secondary | ICD-10-CM | POA: Insufficient documentation

## 2020-08-14 DIAGNOSIS — R1030 Lower abdominal pain, unspecified: Secondary | ICD-10-CM | POA: Insufficient documentation

## 2020-08-14 MED ORDER — ONDANSETRON 4 MG PO TBDP: 4.0000 mg | ORAL_TABLET | Freq: Once | ORAL | Status: DC | PRN

## 2020-08-14 NOTE — ED Triage Notes (Signed)
Pt c/o severe lower abd cramping, LMP start yesterday. Pt reports she has to come here every month for same s/s, pt states she is no longer on BC. Pt layed on floor of triage d/t pain/nausea.

## 2020-08-15 LAB — I-STAT BETA HCG BLOOD, ED (MC, WL, AP ONLY): I-stat hCG, quantitative: <5 m[IU]/mL (ref <5)

## 2020-08-15 LAB — CBC
HCT: 40.7 % (ref 36.0–46.0)
Hemoglobin: 13.6 g/dL (ref 12.0–15.0)
MCH: 31.1 pg (ref 26.0–34.0)
MCHC: 33.4 g/dL (ref 30.0–36.0)
MCV: 92.9 fL (ref 80.0–100.0)
Platelets: 299 10*3/uL (ref 150–400)
RBC: 4.38 MIL/uL (ref 3.87–5.11)
RDW: 13.9 % (ref 11.5–15.5)
WBC: 6.3 10*3/uL (ref 4.0–10.5)
nRBC: 0 % (ref 0.0–0.2)

## 2020-08-15 LAB — LIPASE, BLOOD: Lipase: 30 U/L (ref 11–51)

## 2020-08-15 LAB — COMPREHENSIVE METABOLIC PANEL
ALT: 15 U/L (ref 0–44)
AST: 17 U/L (ref 15–41)
Albumin: 4.1 g/dL (ref 3.5–5.0)
Alkaline Phosphatase: 40 U/L (ref 38–126)
Anion gap: 9 (ref 5–15)
BUN: 13 mg/dL (ref 6–20)
CO2: 23 mmol/L (ref 22–32)
Calcium: 9.3 mg/dL (ref 8.9–10.3)
Chloride: 105 mmol/L (ref 98–111)
Creatinine, Ser: 0.76 mg/dL (ref 0.44–1.00)
GFR, Estimated: >60 mL/min (ref >60)
Glucose, Bld: 145 mg/dL — ABNORMAL HIGH (ref 70–99)
Potassium: 3.4 mmol/L — ABNORMAL LOW (ref 3.5–5.1)
Sodium: 137 mmol/L (ref 135–145)
Total Bilirubin: 0.6 mg/dL (ref 0.3–1.2)
Total Protein: 6.9 g/dL (ref 6.5–8.1)

## 2020-08-15 NOTE — ED Notes (Signed)
lwbs

## 2020-10-04 ENCOUNTER — Other Ambulatory Visit (HOSPITAL_COMMUNITY)
Admission: RE | Admit: 2020-10-04 | Discharge: 2020-10-04 | Disposition: A | Discharge status: Home / Self Care | Payer: Medicaid Other | Source: Ambulatory Visit | Attending: Advanced Practice Midwife | Admitting: Advanced Practice Midwife

## 2020-10-04 ENCOUNTER — Encounter: Payer: Self-pay | Admitting: Advanced Practice Midwife

## 2020-10-04 ENCOUNTER — Ambulatory Visit (INDEPENDENT_AMBULATORY_CARE_PROVIDER_SITE_OTHER): Payer: Medicaid Other | Admitting: Advanced Practice Midwife

## 2020-10-04 ENCOUNTER — Other Ambulatory Visit: Payer: Self-pay

## 2020-10-04 VITALS — BP 107/67 | HR 84 | Ht 68.0 in | Wt 145.6 lb

## 2020-10-04 DIAGNOSIS — Z01419 Encounter for gynecological examination (general) (routine) without abnormal findings: Secondary | ICD-10-CM | POA: Insufficient documentation

## 2020-10-04 MED ORDER — NAPROXEN 500 MG PO TABS: 500.0000 mg | ORAL_TABLET | Freq: Two times a day (BID) | ORAL | 1 refills | Status: DC

## 2020-10-04 MED ORDER — ONDANSETRON 4 MG PO TBDP: 4.0000 mg | ORAL_TABLET | Freq: Three times a day (TID) | ORAL | 1 refills | Status: DC | PRN

## 2020-10-04 NOTE — Progress Notes (Signed)
New GYN in office for annual [facility] Pt wants to discuss [facility] options, does not want to have a menstrual cycle. LMP 12-1-1 Pt reports that she used "summer's eve" vaginal wash and has been having vaginal discharge and odor.

## 2020-10-04 NOTE — Progress Notes (Signed)
GYNECOLOGY ANNUAL PREVENTATIVE CARE ENCOUNTER NOTE  Subjective:   Katrina Cain is a 24 y.o. G0P0000 female here for a routine annual gynecologic exam.  Current complaints: ?vaginal DC/odor unsure if related to summers eve or honey pot.   Denies abnormal vaginal bleeding, discharge, pelvic pain, problems with intercourse or other gynecologic concerns.   Patient has very heavy and painful periods. She is interested in trying a birth control that can help her with this and ideally a birth control that will stop her periods altogether. She was seen at the ED in June 2021 for dysmenorrhea.    Gynecologic History Patient's last menstrual period was 09/13/2020. Contraception: none Last Pap: 2018. Results were: normal Last mammogram: NA, age. Results were: NA  Obstetric History OB History  Gravida Para Term Preterm AB Living  0 0 0 0 0 0  SAB IAB Ectopic Multiple Live Births  0 0 0 0 0    Past Medical History:  Diagnosis Date  . Asthma     History reviewed. No pertinent surgical history.  Current Outpatient Medications on File Prior to Visit  Medication Sig Dispense Refill  . naproxen (NAPROSYN) 500 MG tablet Take 1 tablet (500 mg total) by mouth every 12 (twelve) hours as needed for mild pain or moderate pain. (Patient not taking: Reported on 10/04/2020) 30 tablet 0  . ondansetron (ZOFRAN ODT) 4 MG disintegrating tablet Take 1 tablet (4 mg total) by mouth every 8 (eight) hours as needed for nausea or vomiting. (Patient not taking: Reported on 10/04/2020) 10 tablet 0  . [DISCONTINUED] promethazine (PHENERGAN) 25 MG tablet Take 1 tablet (25 mg total) by mouth every 6 (six) hours as needed for nausea or vomiting. 12 tablet 0   No current facility-administered medications on file prior to visit.    No Known Allergies  Social History   Socioeconomic History  . Marital status: Single    Spouse name: Not on file  . Number of children: Not on file  . Years of education: Not on file   . Highest education level: Not on file  Occupational History  . Not on file  Tobacco Use  . Smoking status: Never Smoker  . Smokeless tobacco: Never Used  Substance and Sexual Activity  . Alcohol use: No  . Drug use: Not Currently    Types: Marijuana  . Sexual activity: Yes  Other Topics Concern  . Not on file  Social History Narrative  . Not on file   Social Determinants of Health   Financial Resource Strain: Not on file  Food Insecurity: Not on file  Transportation Needs: Not on file  Physical Activity: Not on file  Stress: Not on file  Social Connections: Not on file  Intimate Partner Violence: Not on file    Family History  Problem Relation Age of Onset  . Diabetes Mother   . Hypertension Mother   . Cancer Father     The following portions of the patient's history were reviewed and updated as appropriate: allergies, current medications, past family history, past medical history, past social history, past surgical history and problem list.  Review of Systems Pertinent items noted in HPI and remainder of comprehensive ROS otherwise negative.   Objective:  BP 107/67   Pulse 84   Ht 5\' 8"  (1.727 m)   Wt 145 lb 9.6 oz (66 kg)   LMP 09/13/2020   BMI 22.14 kg/m  CONSTITUTIONAL: Well-developed, well-nourished female in no acute distress.  HENT:  Normocephalic, atraumatic,  External right and left ear normal. Oropharynx is clear and moist EYES: Conjunctivae and EOM are normal. Pupils are equal, round, and reactive to light. No scleral icterus.  NECK: Normal range of motion, supple, no masses.  Normal thyroid.  SKIN: Skin is warm and dry. No rash noted. Not diaphoretic. No erythema. No pallor. NEUROLOGIC: Alert and oriented to person, place, and time. Normal reflexes, muscle tone coordination. No cranial nerve deficit noted. PSYCHIATRIC: Normal mood and affect. Normal behavior. Normal judgment and thought content. CARDIOVASCULAR: Normal heart rate noted, regular  rhythm RESPIRATORY: Clear to auscultation bilaterally. Effort and breath sounds normal, no problems with respiration noted. BREASTS: Symmetric in size. No masses, skin changes, nipple drainage, or lymphadenopathy. ABDOMEN: Soft, normal bowel sounds, no distention noted.  No tenderness, rebound or guarding.  PELVIC: Normal appearing external genitalia; normal appearing vaginal mucosa and cervix.  No abnormal discharge noted.  Pap smear obtained.  Normal uterine size, no other palpable masses, no uterine or adnexal tenderness. MUSCULOSKELETAL: Normal range of motion. No tenderness.  No cyanosis, clubbing, or edema.  2+ distal pulses.   Assessment and Plan:  1. Women's annual routine gynecological examination - reviewed BC options. Patient would like to try Progesterone IUD to see if that helps periods. Offered to have this done today, but she would like to wait and will schedule a time to come back.   - rx zofran and naproxen for patient. She has gotten this from the ED when she was seen for dymenorrhea and states it helps. Refills sent in for patient to have.   - recommended patient to stop using summers eve and other soaps/washes and to clean vulva with regular water.   Will follow up results of pap smear and manage accordingly. Routine preventative health maintenance measures emphasized. Please refer to After Visit Summary for other counseling recommendations.    Thressa Sheller DNP, CNM  10/04/20  9:27 AM

## 2020-10-10 LAB — CYTOLOGY - PAP
Comment: NEGATIVE
Diagnosis: HIGH — AB
High risk HPV: POSITIVE — AB

## 2020-10-11 ENCOUNTER — Telehealth: Payer: Self-pay

## 2020-10-11 DIAGNOSIS — Z20822 Contact with and (suspected) exposure to covid-19: Secondary | ICD-10-CM | POA: Diagnosis not present

## 2020-10-11 NOTE — Telephone Encounter (Signed)
Patient has been informed of pap results/recommendation concerning her HPV status. She will be schedule for a colp with one of the providers. Patient voice understanding of HPV and how we can treat her at this time.

## 2020-10-24 ENCOUNTER — Encounter: Payer: Self-pay | Admitting: Obstetrics and Gynecology

## 2020-10-24 ENCOUNTER — Ambulatory Visit (INDEPENDENT_AMBULATORY_CARE_PROVIDER_SITE_OTHER): Payer: Self-pay | Admitting: Obstetrics and Gynecology

## 2020-10-24 ENCOUNTER — Other Ambulatory Visit: Payer: Self-pay

## 2020-10-24 ENCOUNTER — Other Ambulatory Visit (HOSPITAL_COMMUNITY)
Admission: RE | Admit: 2020-10-24 | Discharge: 2020-10-24 | Disposition: A | Discharge status: Home / Self Care | Payer: Medicaid Other | Source: Ambulatory Visit | Attending: Obstetrics and Gynecology | Admitting: Obstetrics and Gynecology

## 2020-10-24 ENCOUNTER — Encounter: Payer: Self-pay | Admitting: *Deleted

## 2020-10-24 VITALS — BP 120/70 | HR 72 | Wt 148.0 lb

## 2020-10-24 DIAGNOSIS — R87613 High grade squamous intraepithelial lesion on cytologic smear of cervix (HGSIL): Secondary | ICD-10-CM | POA: Diagnosis present

## 2020-10-24 NOTE — Progress Notes (Signed)
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  Placed in lithotomy position. Cervix viewed with speculum and colposcope after application of acetic acid.   Colposcopy adequate?  yes Acetowhite lesions? Yes from 1-8 o'clock Punctation? no Mosaicism?  no Abnormal vasculature?  no Biopsies? Yes- 2, 4 and 7 o'clock ECC? yes  COMMENTS:  Patient was given post procedure instructions.  She will return in 2 weeks for results.  Patient is also interested in IUD for contraception and plans to return post cervical dysplasia evaluation  Catalina Antigua, MD

## 2020-10-24 NOTE — Patient Instructions (Signed)
Loop Electrosurgical Excision Procedure Loop electrosurgical excision procedure (LEEP) is the cutting and removal (excision) of tissue from the cervix. The cervix is the bottom part of the uterus that opens into the vagina. The tissue that is removed from the cervix is examined to see if there are precancerous cells or cancer cells present. LEEP may be done when:  You have abnormal bleeding from your cervix.  You have an abnormal Pap test result.  Your health care provider finds an abnormality on your cervix during a pelvic exam. LEEP typically only takes a few minutes and is often done in the health care provider's office. The procedure is safe for women who are trying to get pregnant. However, the procedure is usually not done during a menstrual period or during pregnancy. Tell a health care provider about:  Any allergies you have.  All medicines you are taking, including vitamins, herbs, eye drops, creams, and over-the-counter medicines.  Any blood disorders you have.  Any medical conditions you have, including current or past vaginal infections such as herpes or sexually-transmitted infections (STIs).  Whether you are pregnant or may be pregnant.  Whether or not you are having vaginal bleeding on the day of the procedure. What are the risks? Generally, this is a safe procedure. However, problems may occur, including:  Infection.  Bleeding.  Allergic reactions to medicines.  Changes or scarring in the cervix.  Increased risk of early (preterm) labor in future pregnancies. What happens before the procedure?  Ask your health care provider about: ? Changing or stopping your regular medicines. This is especially important if you are taking diabetes medicines or blood thinners. ? Taking medicines such as aspirin and ibuprofen. These medicines can thin your blood. Do not take these medicines unless your health care provider tells you to take them. ? Taking over-the-counter  medicines, vitamins, herbs, and supplements.  Your health care provider may recommend that you take pain medicine before the procedure.  Ask your health care provider if you should plan to have someone take you home after the procedure. What happens during the procedure?  An instrument called a speculum will be placed in your vagina. This will allow your health care provider to see your cervix.  You will be given a medicine to numb the area (local anesthetic). The medicine will be injected into your cervix and the surrounding area.  A solution will be applied to your cervix. This solution will help the health care provider find the abnormal cells that need to be removed.  A thin wire loop will be passed through your vagina. The wire will be used to burn (cauterize) the cervical tissue with an electrical current.  You may feel faint during the procedure. Tell your health care provider right away if you feel this way.  The abnormal cervical tissue will be removed.  Any open blood vessels will be cauterized to prevent bleeding.  A paste may be applied to the cauterized area of your cervix to help prevent bleeding.  The sample of cervical tissue will be examined under a microscope. The procedure may vary among health care providers and hospitals.   What can I expect after the procedure? After the procedure, it is common to have:  Mild abdominal cramps that are similar to menstrual cramps. These may last for up to 1 week.  A small amount of pink-tinged or bloody vaginal discharge, including light to moderate bleeding, for 1-2 weeks.  A dark-colored discharge coming from your vagina. This is   from the paste that was used on the cervix to prevent bleeding. It is up to you to get the results of your procedure. Ask your health care provider, or the department that is doing the procedure, when your results will be ready. Follow these instructions at home:  Take over-the-counter and  prescription medicines only as told by your health care provider.  Return to your normal activities as told by your health care provider. Ask your health care provider what activities are safe for you.  Do not put anything in your vagina for 2 weeks after the procedure or until your health care provider says that it is okay. This includes tampons, creams, and douches.  Do not have sex until your health care provider approves.  Keep all follow-up visits as told by your health care provider. This is important. Contact a health care provider if you:  Have a fever or chills.  Feel unusually weak.  Have vaginal bleeding that is heavier or longer than a normal menstrual cycle. A sign of this can be soaking a pad with blood or bleeding with clots.  Develop a bad smelling vaginal discharge.  Have severe abdominal pain or cramping. Summary  Loop electrosurgical excision procedure (LEEP) is the removal of tissue from the cervix. The removed tissue will be checked for precancerous cells or cancer cells.  LEEP typically only takes a few minutes and is often done in the health care provider's office.  Do not put anything in your vagina for 2 weeks after the procedure or until your health care provider says that it is okay. This includes tampons, creams, and douches.  Keep all follow-up visits as told by your health care provider. Ask your health care provider, or the department that is doing the procedure, when your results will be ready. This information is not intended to replace advice given to you by your health care provider. Make sure you discuss any questions you have with your health care provider. Document Revised: 10/23/2018 Document Reviewed: 10/23/2018 Elsevier Patient Education  2021 [employer]vier Inc[employer]  

## 2020-10-25 LAB — SURGICAL PATHOLOGY

## 2020-10-26 ENCOUNTER — Encounter (HOSPITAL_COMMUNITY): Payer: Self-pay | Admitting: Emergency Medicine

## 2020-10-26 ENCOUNTER — Emergency Department (HOSPITAL_COMMUNITY)
Admission: EM | Admit: 2020-10-26 | Discharge: 2020-10-26 | Disposition: A | Discharge status: Home / Self Care | Payer: Medicaid Other | Attending: Emergency Medicine | Admitting: Emergency Medicine

## 2020-10-26 DIAGNOSIS — Z20822 Contact with and (suspected) exposure to covid-19: Secondary | ICD-10-CM | POA: Insufficient documentation

## 2020-10-26 DIAGNOSIS — J45909 Unspecified asthma, uncomplicated: Secondary | ICD-10-CM | POA: Insufficient documentation

## 2020-10-26 DIAGNOSIS — M791 Myalgia, unspecified site: Secondary | ICD-10-CM | POA: Insufficient documentation

## 2020-10-26 DIAGNOSIS — R519 Headache, unspecified: Secondary | ICD-10-CM | POA: Insufficient documentation

## 2020-10-26 MED ORDER — ALBUTEROL SULFATE HFA 108 (90 BASE) MCG/ACT IN AERS
2.0000 | INHALATION_SPRAY | Freq: Once | RESPIRATORY_TRACT | Status: AC
  Administered 2020-10-26: 2 via RESPIRATORY_TRACT
  Filled 2020-10-26: qty 6.7

## 2020-10-26 MED ORDER — IBUPROFEN 400 MG PO TABS
600.0000 mg | ORAL_TABLET | Freq: Once | ORAL | Status: AC
  Administered 2020-10-26: 600 mg via ORAL
  Filled 2020-10-26: qty 1

## 2020-10-26 NOTE — ED Triage Notes (Signed)
Patient complains of generalized body aches and sore throat that started last night. Patient was tested for COVID yesterday and is waiting for result.

## 2020-10-26 NOTE — ED Provider Notes (Signed)
[facility] EMERGENCY DEPARTMENT Provider Note   CSN: [id number] Arrival date & time: 10/26/20  7654     History Chief Complaint  Patient presents with  . Generalized Body Aches    Katrina Cain is a 25 y.o. female.  HPI     This is a 25 year old female with reported medical history of asthma who presents with upper respiratory symptoms.  Patient reports "I feel like I got hit by a Mack truck and I think it may have COVID."  She states that her father has suspected COVID.  She was tested yesterday and is awaiting results.  Mostly she states that she has extreme body aches.  She took some DayQuil which seems to have helped.  She also has some headache.  No chest pain or shortness of breath.  No noted fevers at home.  She is unvaccinated.  Past Medical History:  Diagnosis Date  . Asthma     There are no problems to display for this patient.   History reviewed. No pertinent surgical history.   OB History    Gravida  0   Para  0   Term  0   Preterm  0   AB  0   Living  0     SAB  0   IAB  0   Ectopic  0   Multiple  0   Live Births  0           Family History  Problem Relation Age of Onset  . Diabetes Mother   . Hypertension Mother   . Cancer Father     Social History   Tobacco Use  . Smoking status: Never Smoker  . Smokeless tobacco: Never Used  Substance Use Topics  . Alcohol use: No  . Drug use: Not Currently    Types: Marijuana    Home Medications Prior to Admission medications   Medication Sig Start Date End Date Taking? Authorizing Provider  naproxen (NAPROSYN) 500 MG tablet Take 1 tablet (500 mg total) by mouth 2 (two) times daily with a meal. Patient not taking: Reported on 10/24/2020 10/04/20   Thressa Sheller D, CNM  ondansetron (ZOFRAN ODT) 4 MG disintegrating tablet Take 1 tablet (4 mg total) by mouth every 8 (eight) hours as needed for nausea or vomiting. Patient not taking: Reported on 10/24/2020 10/04/20    Armando Reichert, CNM  promethazine (PHENERGAN) 25 MG tablet Take 1 tablet (25 mg total) by mouth every 6 (six) hours as needed for nausea or vomiting. 02/27/19 03/30/20  Antony Madura, PA-C    Allergies    Patient has no known allergies.  Review of Systems   Review of Systems  Constitutional: Positive for chills. Negative for fever.  Respiratory: Negative for cough and shortness of breath.   Cardiovascular: Negative for chest pain.  Gastrointestinal: Negative for abdominal pain, nausea and vomiting.  Genitourinary: Negative for dysuria.  Musculoskeletal: Positive for myalgias.  All other systems reviewed and are negative.   Physical Exam Updated Vital Signs BP 117/71   Pulse 99   Temp 98.6 F (37 C) (Oral)   Resp 16   Ht 1.727 m (5\' 8" )   Wt 67.1 kg   LMP 10/13/2020   SpO2 100%   BMI 22.50 kg/m   Physical Exam Vitals and nursing note reviewed.  Constitutional:      Appearance: She is well-developed and well-nourished. She is not ill-appearing.  HENT:     Head: Normocephalic and  atraumatic.     Nose: Nose normal.     Mouth/Throat:     Mouth: Mucous membranes are moist.  Eyes:     Pupils: Pupils are equal, round, and reactive to light.  Cardiovascular:     Rate and Rhythm: Normal rate and regular rhythm.     Heart sounds: Normal heart sounds.  Pulmonary:     Effort: Pulmonary effort is normal. No respiratory distress.     Breath sounds: No wheezing.  Abdominal:     General: Bowel sounds are normal.     Palpations: Abdomen is soft.     Tenderness: There is no guarding or rebound.  Musculoskeletal:     Cervical back: Neck supple.     Right lower leg: No edema.     Left lower leg: No edema.  Skin:    General: Skin is warm and dry.  Neurological:     Mental Status: She is alert and oriented to person, place, and time.  Psychiatric:        Mood and Affect: Mood and affect and mood normal.     ED Results / Procedures / Treatments   Labs (all labs ordered  are listed, but only abnormal results are displayed) Labs Reviewed - No data to display  EKG None  Radiology No results found.  Procedures Procedures (including critical care time)  Medications Ordered in ED Medications  ibuprofen (ADVIL) tablet 600 mg (has no administration in time range)  albuterol (VENTOLIN HFA) 108 (90 Base) MCG/ACT inhaler 2 puff (has no administration in time range)    ED Course  I have reviewed the triage vital signs and the nursing notes.  Pertinent labs & imaging results that were available during my care of the patient were reviewed by me and considered in my medical decision making (see chart for details).    MDM Rules/Calculators/A&P                          Patient presents with concern for COVID-19 infection.  She is awaiting confirmatory testing.  She is overall nontoxic and vital signs are reassuring.  Primarily complains of myalgias and headache.  This is improved with DayQuil.  No shortness of breath or chest pain.  She is not hypoxic.  Her physical exam is largely reassuring and she appears hydrated.  Patient was provided with an inhaler given her history of asthma.  Additionally, she was given a dose ibuprofen.  Recommend supportive measures at home and quarantine.  Katrina Cain was evaluated in Emergency Department on 10/26/2020 for the symptoms described in the history of present illness. She was evaluated in the context of the global COVID-19 pandemic, which necessitated consideration that the patient might be at risk for infection with the SARS-CoV-2 virus that causes COVID-19. Institutional protocols and algorithms that pertain to the evaluation of patients at risk for COVID-19 are in a state of rapid change based on information released by regulatory bodies including the CDC and federal and state organizations. These policies and algorithms were followed during the patient's care in the ED.  Final Clinical Impression(s) / ED  Diagnoses Final diagnoses:  Suspected COVID-19 virus infection    Rx / DC Orders ED Discharge Orders    None       Shon Baton, MD 10/26/20 1104

## 2020-10-26 NOTE — Discharge Instructions (Addendum)
You were seen today with suspected COVID-19.  You should isolate and quarantine until you have your test results back.  Use your inhaler as needed.  Ibuprofen or Tylenol for body aches and pains.

## 2020-10-31 ENCOUNTER — Encounter: Payer: Medicaid Other | Admitting: Obstetrics and Gynecology

## 2020-11-11 ENCOUNTER — Emergency Department (HOSPITAL_COMMUNITY)
Admission: EM | Admit: 2020-11-11 | Discharge: 2020-11-12 | Disposition: A | Discharge status: Home / Self Care | Payer: Medicaid Other | Attending: Emergency Medicine | Admitting: Emergency Medicine

## 2020-11-11 ENCOUNTER — Other Ambulatory Visit: Payer: Self-pay

## 2020-11-11 ENCOUNTER — Encounter (HOSPITAL_COMMUNITY): Payer: Self-pay

## 2020-11-11 DIAGNOSIS — J45909 Unspecified asthma, uncomplicated: Secondary | ICD-10-CM | POA: Insufficient documentation

## 2020-11-11 DIAGNOSIS — N946 Dysmenorrhea, unspecified: Secondary | ICD-10-CM | POA: Insufficient documentation

## 2020-11-11 MED ORDER — KETOROLAC TROMETHAMINE 30 MG/ML IJ SOLN
30.0000 mg | Freq: Once | INTRAMUSCULAR | Status: AC
  Administered 2020-11-11: 30 mg via INTRAVENOUS
  Filled 2020-11-11: qty 1

## 2020-11-11 MED ORDER — ONDANSETRON HCL 4 MG/2ML IJ SOLN
4.0000 mg | Freq: Once | INTRAMUSCULAR | Status: AC
  Administered 2020-11-11: 4 mg via INTRAVENOUS
  Filled 2020-11-11: qty 2

## 2020-11-11 NOTE — ED Triage Notes (Signed)
Pt came in with c/o menstrual cramps and N/V associated with it. She states she is seeing an OBGYN, but she cannot get on [facility] [place] until Feb. Pt states that the vomiting started at approx 2000.

## 2020-11-11 NOTE — ED Provider Notes (Signed)
[facility]-EMERGENCY DEPT Provider Note   CSN: [id number] Arrival date & time: 11/11/20  2253     History Chief Complaint  Patient presents with  . Nausea  . Emesis    Katrina Cain is a 25 y.o. female.  HPI     This is a 25 year old female who presents with menstrual cramps and nausea and vomiting.  Patient reports pretty severe history of menstrual cramps and nausea vomiting with her period.  She started her period this morning.  She has had heavy flow and reports lower abdominal cramping and pain.  She states that she took ibuprofen at home with no relief.  She states "normally pain medications at home did not help me."  She is seeing a gynecologist and hoping to get on birth control.  She reports multiple episodes of nonbilious, nonbloody emesis.  No recent illnesses or fevers.  No diarrhea.  Denies urinary symptoms.  Rates her pain at 10 out of 10.  Past Medical History:  Diagnosis Date  . Asthma     There are no problems to display for this patient.   History reviewed. No pertinent surgical history.   OB History    Gravida  0   Para  0   Term  0   Preterm  0   AB  0   Living  0     SAB  0   IAB  0   Ectopic  0   Multiple  0   Live Births  0           Family History  Problem Relation Age of Onset  . Diabetes Mother   . Hypertension Mother   . Cancer Father     Social History   Tobacco Use  . Smoking status: Never Smoker  . Smokeless tobacco: Never Used  Substance Use Topics  . Alcohol use: No  . Drug use: Not Currently    Types: Marijuana    Home Medications Prior to Admission medications   Medication Sig Start Date End Date Taking? Authorizing Provider  naproxen (NAPROSYN) 500 MG tablet Take 1 tablet (500 mg total) by mouth 2 (two) times daily. 11/12/20  Yes Nilam Quakenbush, Mayer Masker, MD  ondansetron (ZOFRAN ODT) 4 MG disintegrating tablet Take 1 tablet (4 mg total) by mouth every 8 (eight) hours as needed for  nausea or vomiting. 11/12/20  Yes Cadince Hilscher, Mayer Masker, MD  promethazine (PHENERGAN) 25 MG tablet Take 1 tablet (25 mg total) by mouth every 6 (six) hours as needed for nausea or vomiting. 02/27/19 03/30/20  Antony Madura, PA-C    Allergies    Patient has no known allergies.  Review of Systems   Review of Systems  Constitutional: Negative for fever.  Respiratory: Negative for shortness of breath.   Cardiovascular: Negative for chest pain.  Gastrointestinal: Positive for abdominal pain, nausea and vomiting. Negative for diarrhea.  Genitourinary: Positive for vaginal bleeding. Negative for dysuria.    Physical Exam Updated Vital Signs BP 113/68   Pulse 63   Temp 98 F (36.7 C) (Oral)   Resp 17   Ht 1.727 m (5\' 8" )   Wt 67.1 kg   LMP 10/13/2020   SpO2 99%   BMI 22.50 kg/m   Physical Exam Vitals and nursing note reviewed.  Constitutional:      Appearance: She is well-developed and well-nourished. She is not ill-appearing.     Comments: Uncomfortable appearing but nontoxic  HENT:     Head:  Normocephalic and atraumatic.     Nose: Nose normal.     Mouth/Throat:     Mouth: Mucous membranes are moist.  Eyes:     Pupils: Pupils are equal, round, and reactive to light.  Cardiovascular:     Rate and Rhythm: Normal rate and regular rhythm.     Heart sounds: Normal heart sounds.  Pulmonary:     Effort: Pulmonary effort is normal. No respiratory distress.     Breath sounds: No wheezing.  Abdominal:     General: Bowel sounds are normal.     Palpations: Abdomen is soft.     Tenderness: There is no abdominal tenderness. There is no guarding or rebound.  Musculoskeletal:     Cervical back: Neck supple.  Skin:    General: Skin is warm and dry.  Neurological:     Mental Status: She is alert and oriented to person, place, and time.  Psychiatric:        Mood and Affect: Mood and affect and mood normal.     ED Results / Procedures / Treatments   Labs (all labs ordered are listed,  but only abnormal results are displayed) Labs Reviewed  URINALYSIS, ROUTINE W REFLEX MICROSCOPIC - Abnormal; Notable for the following components:      Result Value   APPearance HAZY (*)    Hgb urine dipstick MODERATE (*)    Protein, ur 100 (*)    Leukocytes,Ua TRACE (*)    RBC / HPF >50 (*)    All other components within normal limits  PREGNANCY, URINE    EKG None  Radiology No results found.  Procedures Procedures   Medications Ordered in ED Medications  ketorolac (TORADOL) 30 MG/ML injection 30 mg (30 mg Intravenous Given 11/11/20 2353)  ondansetron (ZOFRAN) injection 4 mg (4 mg Intravenous Given 11/11/20 2357)    ED Course  I have reviewed the triage vital signs and the nursing notes.  Pertinent labs & imaging results that were available during my care of the patient were reviewed by me and considered in my medical decision making (see chart for details).  Clinical Course as of 11/12/20 0200  Sun Nov 12, 2020  0158 Patient improved and PO challenging. [CH]    Clinical Course User Index [CH] Danie Hannig, Mayer Masker, MD   MDM Rules/Calculators/A&P                           Patient presents with abdominal cramping and nausea vomiting.  History of dysmenorrhea.  Not currently on birth control.  She is overall nontoxic and vital signs are reassuring.  She does appear uncomfortable.  No focal findings on exam.  Urinalysis and urine pregnancy ordered.  Patient was given Toradol and Zofran.  Urinalysis reviewed and without evidence of UTI.  Urine pregnancy test is negative.  On recheck, she is much more comfortable.  Recommend scheduled NSAIDs such as naproxen and Zofran at home.  Given benign abdominal exam, doubt other intra-abdominal pathology.  After history, exam, and medical workup I feel the patient has been appropriately medically screened and is safe for discharge home. Pertinent diagnoses were discussed with the patient. Patient was given return precautions.   Final  Clinical Impression(s) / ED Diagnoses Final diagnoses:  Dysmenorrhea    Rx / DC Orders ED Discharge Orders         Ordered    naproxen (NAPROSYN) 500 MG tablet  2 times daily  11/12/20 0152    ondansetron (ZOFRAN ODT) 4 MG disintegrating tablet  Every 8 hours PRN        11/12/20 0152           Shon Baton, MD 11/12/20 [phone]

## 2020-11-12 LAB — URINALYSIS, ROUTINE W REFLEX MICROSCOPIC
Bacteria, UA: NONE SEEN
Bilirubin Urine: NEGATIVE
Glucose, UA: NEGATIVE mg/dL
Ketones, ur: NEGATIVE mg/dL
Nitrite: NEGATIVE
Protein, ur: 100 mg/dL — AB
RBC / HPF: >50 RBC/hpf — ABNORMAL HIGH (ref 0–5)
Specific Gravity, Urine: 1.028 (ref 1.005–1.030)
pH: 7 (ref 5.0–8.0)

## 2020-11-12 LAB — PREGNANCY, URINE: Preg Test, Ur: NEGATIVE

## 2020-11-12 MED ORDER — NAPROXEN 500 MG PO TABS: 500.0000 mg | ORAL_TABLET | Freq: Two times a day (BID) | ORAL | 0 refills | Status: DC

## 2020-11-12 MED ORDER — ONDANSETRON 4 MG PO TBDP: 4.0000 mg | ORAL_TABLET | Freq: Three times a day (TID) | ORAL | 0 refills | Status: DC | PRN

## 2020-11-12 NOTE — Discharge Instructions (Addendum)
Follow-up with your OBGYN

## 2020-11-15 ENCOUNTER — Telehealth: Payer: Self-pay

## 2020-11-15 NOTE — Telephone Encounter (Signed)
Attempted to contact patient regarding [employer] [place] application. Left message on voicemail requesting return call.  

## 2020-11-17 ENCOUNTER — Telehealth: Payer: Self-pay

## 2020-11-17 NOTE — Telephone Encounter (Signed)
Attempted to contact patient regarding [employer] Medicaid application. Left message on voicemail requesting return call.

## 2020-11-24 ENCOUNTER — Telehealth: Payer: Self-pay

## 2020-11-24 NOTE — Telephone Encounter (Addendum)
Patient returned call, Medicaid application was completed.   Attempted to contact patient regarding completing medicaid application for upcoming LEEP Procedure with [facility]. Left message on voicemail requesting return call.

## 2020-11-27 ENCOUNTER — Ambulatory Visit: Payer: Medicaid Other | Admitting: Obstetrics and Gynecology

## 2020-12-19 ENCOUNTER — Ambulatory Visit (INDEPENDENT_AMBULATORY_CARE_PROVIDER_SITE_OTHER): Payer: Medicaid Other

## 2020-12-19 ENCOUNTER — Other Ambulatory Visit: Payer: Self-pay

## 2020-12-19 VITALS — BP 127/73 | HR 78 | Ht 68.0 in | Wt 147.0 lb

## 2020-12-19 DIAGNOSIS — Z3201 Encounter for pregnancy test, result positive: Secondary | ICD-10-CM

## 2020-12-19 LAB — POCT URINE PREGNANCY: Preg Test, Ur: POSITIVE — AB

## 2020-12-19 NOTE — Progress Notes (Addendum)
Ms. Bourn presents today for UPT. She has no unusual complaints  LMP:11/07/2020    OBJECTIVE: Appears well, in no apparent distress.  OB History    Gravida  1   Para  0   Term  0   Preterm  0   AB  0   Living  0     SAB  0   IAB  0   Ectopic  0   Multiple  0   Live Births  0          Home UPT Result:POSITIVE X2 In-Office UPT result: POSITIVE  I have reviewed the patient's medical, obstetrical, social, and family histories, and medications.   ASSESSMENT: Positive pregnancy test LMP 11/07/2020 EDD 08/14/2021 [place]     [redacted]w[redacted]d  PLAN Prenatal care to be completed at: [facility] for NOB Intake and NOB visits  Patient was assessed and managed by nursing staff during this encounter. I have reviewed the chart and agree with the documentation and plan. I have also made any necessary editorial changes.  Coral Ceo, MD 12/20/2020 4:38 PM

## 2020-12-25 ENCOUNTER — Ambulatory Visit: Payer: Medicaid Other | Admitting: Obstetrics and Gynecology

## 2021-01-17 ENCOUNTER — Other Ambulatory Visit: Payer: Self-pay

## 2021-01-17 ENCOUNTER — Ambulatory Visit (INDEPENDENT_AMBULATORY_CARE_PROVIDER_SITE_OTHER): Payer: Medicaid Other

## 2021-01-17 ENCOUNTER — Other Ambulatory Visit (HOSPITAL_COMMUNITY)
Admission: RE | Admit: 2021-01-17 | Discharge: 2021-01-17 | Disposition: A | Discharge status: Home / Self Care | Payer: Medicaid Other | Source: Ambulatory Visit | Attending: Obstetrics and Gynecology | Admitting: Obstetrics and Gynecology

## 2021-01-17 VITALS — BP 110/68 | HR 93 | Ht 68.0 in | Wt 154.2 lb

## 2021-01-17 DIAGNOSIS — Z3A10 10 weeks gestation of pregnancy: Secondary | ICD-10-CM

## 2021-01-17 DIAGNOSIS — N898 Other specified noninflammatory disorders of vagina: Secondary | ICD-10-CM

## 2021-01-17 DIAGNOSIS — Z3401 Encounter for supervision of normal first pregnancy, first trimester: Secondary | ICD-10-CM

## 2021-01-17 DIAGNOSIS — O0993 Supervision of high risk pregnancy, unspecified, third trimester: Secondary | ICD-10-CM | POA: Insufficient documentation

## 2021-01-17 DIAGNOSIS — O219 Vomiting of pregnancy, unspecified: Secondary | ICD-10-CM

## 2021-01-17 DIAGNOSIS — Z789 Other specified health status: Secondary | ICD-10-CM

## 2021-01-17 MED ORDER — DOXYLAMINE-PYRIDOXINE 10-10 MG PO TBEC: 2.0000 | DELAYED_RELEASE_TABLET | Freq: Every day | ORAL | 5 refills | Status: DC

## 2021-01-17 MED ORDER — BLOOD PRESSURE KIT DEVI: 1.0000 | 0 refills | Status: DC

## 2021-01-17 NOTE — Progress Notes (Signed)
PRENATAL INTAKE SUMMARY  Ms. Fencl presents today New OB Nurse Interview.  OB History    Gravida  1   Para  0   Term  0   Preterm  0   AB  0   Living  0     SAB  0   IAB  0   Ectopic  0   Multiple  0   Live Births  0          I have reviewed the patient's medical, obstetrical, social, and family histories, medications, and available lab results.  SUBJECTIVE She complains of having some vaginal irritation. She denies having any odor, or discoloration. Self swab performed today.  OBJECTIVE Initial Physical Exam (New OB)  GENERAL APPEARANCE: alert, well appearing   ASSESSMENT Normal pregnancy  PLAN Prenatal care to be completed at [facility] labs to be completed at [facility] Provider visit Blood Pressure kit ordered Baby Scripts ordered U/S performed today reveals single live IUP at [redacted]w[redacted]d FHR 173 PHQ 2 score: 1 GAD 7 Score: 4

## 2021-01-17 NOTE — Progress Notes (Signed)
Agree with A & P. 

## 2021-01-18 LAB — CERVICOVAGINAL ANCILLARY ONLY
Bacterial Vaginitis (gardnerella): POSITIVE — AB
Candida Glabrata: NEGATIVE
Candida Vaginitis: NEGATIVE
Chlamydia: POSITIVE — AB
Comment: NEGATIVE
Comment: NEGATIVE
Comment: NEGATIVE
Comment: NEGATIVE
Comment: NEGATIVE
Comment: NORMAL
Neisseria Gonorrhea: NEGATIVE
Trichomonas: NEGATIVE

## 2021-01-19 ENCOUNTER — Other Ambulatory Visit: Payer: Self-pay | Admitting: Obstetrics and Gynecology

## 2021-01-19 DIAGNOSIS — A749 Chlamydial infection, unspecified: Secondary | ICD-10-CM

## 2021-01-19 DIAGNOSIS — O98811 Other maternal infectious and parasitic diseases complicating pregnancy, first trimester: Secondary | ICD-10-CM

## 2021-01-19 DIAGNOSIS — B9689 Other specified bacterial agents as the cause of diseases classified elsewhere: Secondary | ICD-10-CM

## 2021-01-19 MED ORDER — METRONIDAZOLE 500 MG PO TABS: 500.0000 mg | ORAL_TABLET | Freq: Two times a day (BID) | ORAL | 0 refills | Status: DC

## 2021-01-19 MED ORDER — AZITHROMYCIN 500 MG PO TABS: 1000.0000 mg | ORAL_TABLET | Freq: Once | ORAL | 0 refills | Status: AC

## 2021-01-22 ENCOUNTER — Ambulatory Visit (INDEPENDENT_AMBULATORY_CARE_PROVIDER_SITE_OTHER): Payer: Medicaid Other | Admitting: Obstetrics

## 2021-01-22 ENCOUNTER — Encounter: Payer: Self-pay | Admitting: Obstetrics

## 2021-01-22 ENCOUNTER — Other Ambulatory Visit: Payer: Self-pay

## 2021-01-22 VITALS — BP 117/72 | HR 81 | Wt 156.0 lb

## 2021-01-22 DIAGNOSIS — Z3401 Encounter for supervision of normal first pregnancy, first trimester: Secondary | ICD-10-CM | POA: Diagnosis not present

## 2021-01-22 DIAGNOSIS — Z3A10 10 weeks gestation of pregnancy: Secondary | ICD-10-CM | POA: Diagnosis not present

## 2021-01-22 DIAGNOSIS — O98811 Other maternal infectious and parasitic diseases complicating pregnancy, first trimester: Secondary | ICD-10-CM

## 2021-01-22 DIAGNOSIS — Z31430 Encounter of female for testing for genetic disease carrier status for procreative management: Secondary | ICD-10-CM | POA: Diagnosis not present

## 2021-01-22 DIAGNOSIS — A749 Chlamydial infection, unspecified: Secondary | ICD-10-CM

## 2021-01-22 NOTE — Progress Notes (Signed)
Subjective:    Katrina Cain is being seen today for her first obstetrical visit.  This is a planned pregnancy. She is at [redacted]w[redacted]d gestation. Her obstetrical history is significant for none . Relationship with FOB: significant other, living together. Patient does intend to breast feed. Pregnancy history fully reviewed.  The information documented in the HPI was reviewed and verified.  Menstrual History: OB History    Gravida  1   Para  0   Term  0   Preterm  0   AB  0   Living  0     SAB  0   IAB  0   Ectopic  0   Multiple  0   Live Births  0            Patient's last menstrual period was 11/07/2020 (exact date).    Past Medical History:  Diagnosis Date  . Asthma     History reviewed. No pertinent surgical history.  (Not in a hospital admission)  No Known Allergies  Social History   Tobacco Use  . Smoking status: Never Smoker  . Smokeless tobacco: Never Used  Substance Use Topics  . Alcohol use: No    Family History  Problem Relation Age of Onset  . Diabetes Mother   . Hypertension Mother   . Cancer Father   . Cancer Paternal Grandmother      Review of Systems Constitutional: negative for weight loss Gastrointestinal: positive for nausea and vomiting Genitourinary:negative for genital lesions and vaginal discharge and dysuria Musculoskeletal:negative for back pain Behavioral/Psych: negative for abusive relationship, depression, illegal drug usage and tobacco use    Objective:    BP 117/72   Pulse 81   Wt 156 lb (70.8 kg)   LMP 11/07/2020 (Exact Date)   BMI 23.72 kg/m  General Appearance:    Alert, cooperative, no distress, appears stated age  Head:    Normocephalic, without obvious abnormality, atraumatic  Eyes:    PERRL, conjunctiva/corneas clear, EOM's intact, fundi    benign, both eyes  Ears:    Normal TM's and external ear canals, both ears  Nose:   Nares normal, septum midline, mucosa normal, no drainage    or sinus tenderness   Throat:   Lips, mucosa, and tongue normal; teeth and gums normal  Neck:   Supple, symmetrical, trachea midline, no adenopathy;    thyroid:  no enlargement/tenderness/nodules; no carotid   bruit or JVD  Back:     Symmetric, no curvature, ROM normal, no CVA tenderness  Lungs:     Clear to auscultation bilaterally, respirations unlabored  Chest Wall:    No tenderness or deformity   Heart:    Regular rate and rhythm, S1 and S2 normal, no murmur, rub   or gallop  Breast Exam:    No tenderness, masses, or nipple abnormality  Abdomen:     Soft, non-tender, bowel sounds active all four quadrants,    no masses, no organomegaly  Genitalia:    Normal female without lesion, discharge or tenderness  Extremities:   Extremities normal, atraumatic, no cyanosis or edema  Pulses:   2+ and symmetric all extremities  Skin:   Skin color, texture, turgor normal, no rashes or lesions  Lymph nodes:   Cervical, supraclavicular, and axillary nodes normal  Neurologic:   CNII-XII intact, normal strength, sensation and reflexes    throughout      Lab Review Urine pregnancy test Labs reviewed yes Radiologic studies reviewed yes  Assessment:    Pregnancy at [redacted]w[redacted]d weeks    Plan:      Prenatal vitamins.  Counseling provided regarding continued use of seat belts, cessation of alcohol consumption, smoking or use of illicit drugs; infection precautions i.e., influenza/TDAP immunizations, toxoplasmosis,CMV, parvovirus, listeria and varicella; workplace safety, exercise during pregnancy; routine dental care, safe medications, sexual activity, hot tubs, saunas, pools, travel, caffeine use, fish and methlymercury, potential toxins, hair treatments, varicose veins Weight gain recommendations per IOM guidelines reviewed: underweight/BMI< 18.5--> gain 28 - 40 lbs; normal weight/BMI 18.5 - 24.9--> gain 25 - 35 lbs; overweight/BMI 25 - 29.9--> gain 15 - 25 lbs; obese/BMI >30->gain  11 - 20 lbs Problem list reviewed and  updated. FIRST/CF mutation testing/NIPT/QUAD SCREEN/fragile X/Ashkenazi Jewish population testing/Spinal muscular atrophy discussed: requested. Role of ultrasound in pregnancy discussed; fetal survey: requested. Amniocentesis discussed: not indicated.  No orders of the defined types were placed in this encounter.  Orders Placed This Encounter  Procedures  . Culture, OB Urine  . [place] MFM OB COMP + 14 WK    Standing Status:   Future    Standing Expiration Date:   01/22/2022    Order Specific Question:   Reason for Exam (SYMPTOM  OR DIAGNOSIS REQUIRED)    Answer:   Screening    Order Specific Question:   Preferred Location    Answer:   [facility] Ultrasound  . Genetic Screening  . CBC/D/Plt+RPR+Rh+ABO+Rub Ab...    Follow up in 4 weeks.  I have spent a total of 25 minutes of face-to-face time, excluding clinical staff time, reviewing notes and preparing to see patient, ordering tests and/or medications, and counseling the patient.  Brock Bad, MD 01/22/2021 5:17 PM

## 2021-01-22 NOTE — Progress Notes (Signed)
Done 6 days ago PHQ2=1 and GAD 7=4  New OB, reports no problems today. Last PAP12/22/2021.  Cultures done 01/17/21 +Chlamydia and BV.

## 2021-01-23 ENCOUNTER — Encounter: Payer: Medicaid Other | Admitting: Obstetrics

## 2021-01-23 ENCOUNTER — Encounter: Payer: Medicaid Other | Admitting: Obstetrics and Gynecology

## 2021-01-23 LAB — CBC/D/PLT+RPR+RH+ABO+RUB AB...
Antibody Screen: NEGATIVE
Basophils Absolute: 0 10*3/uL (ref 0.0–0.2)
Basos: 0 %
EOS (ABSOLUTE): 0 10*3/uL (ref 0.0–0.4)
Eos: 0 %
HCV Ab: <0.1 s/co ratio (ref 0.0–0.9)
HIV Screen 4th Generation wRfx: NONREACTIVE
Hematocrit: 37.4 % (ref 34.0–46.6)
Hemoglobin: 12.7 g/dL (ref 11.1–15.9)
Hepatitis B Surface Ag: NEGATIVE
Immature Grans (Abs): 0 10*3/uL (ref 0.0–0.1)
Immature Granulocytes: 0 %
Lymphocytes Absolute: 1.5 10*3/uL (ref 0.7–3.1)
Lymphs: 27 %
MCH: 30.1 pg (ref 26.6–33.0)
MCHC: 34 g/dL (ref 31.5–35.7)
MCV: 89 fL (ref 79–97)
Monocytes Absolute: 0.9 10*3/uL (ref 0.1–0.9)
Monocytes: 17 %
Neutrophils Absolute: 2.9 10*3/uL (ref 1.4–7.0)
Neutrophils: 56 %
Platelets: 234 10*3/uL (ref 150–450)
RBC: 4.22 x10E6/uL (ref 3.77–5.28)
RDW: 13.2 % (ref 11.7–15.4)
RPR Ser Ql: NONREACTIVE
Rh Factor: POSITIVE
Rubella Antibodies, IGG: 5.13 index (ref >0.99)
WBC: 5.3 10*3/uL (ref 3.4–10.8)

## 2021-01-23 LAB — HCV INTERPRETATION

## 2021-01-25 LAB — URINE CULTURE, OB REFLEX

## 2021-01-25 LAB — CULTURE, OB URINE

## 2021-01-27 ENCOUNTER — Other Ambulatory Visit: Payer: Self-pay | Admitting: Obstetrics

## 2021-01-27 DIAGNOSIS — O2340 Unspecified infection of urinary tract in pregnancy, unspecified trimester: Secondary | ICD-10-CM

## 2021-01-27 MED ORDER — AMOXICILLIN-POT CLAVULANATE 875-125 MG PO TABS: 1.0000 | ORAL_TABLET | Freq: Two times a day (BID) | ORAL | 0 refills | Status: DC

## 2021-01-31 ENCOUNTER — Encounter: Payer: Self-pay | Admitting: Obstetrics

## 2021-02-02 ENCOUNTER — Encounter: Payer: Self-pay | Admitting: Obstetrics

## 2021-02-05 ENCOUNTER — Encounter: Payer: Self-pay | Admitting: Obstetrics

## 2021-02-07 ENCOUNTER — Inpatient Hospital Stay (HOSPITAL_COMMUNITY)
Admission: AD | Admit: 2021-02-07 | Discharge: 2021-02-07 | Disposition: A | Discharge status: Home / Self Care | Payer: Medicaid Other | Attending: Family Medicine | Admitting: Family Medicine

## 2021-02-07 ENCOUNTER — Other Ambulatory Visit: Payer: Self-pay

## 2021-02-07 ENCOUNTER — Encounter (HOSPITAL_COMMUNITY): Payer: Self-pay | Admitting: Family Medicine

## 2021-02-07 DIAGNOSIS — Z3A13 13 weeks gestation of pregnancy: Secondary | ICD-10-CM | POA: Diagnosis not present

## 2021-02-07 DIAGNOSIS — O26891 Other specified pregnancy related conditions, first trimester: Secondary | ICD-10-CM | POA: Insufficient documentation

## 2021-02-07 DIAGNOSIS — N898 Other specified noninflammatory disorders of vagina: Secondary | ICD-10-CM | POA: Insufficient documentation

## 2021-02-07 DIAGNOSIS — O23591 Infection of other part of genital tract in pregnancy, first trimester: Secondary | ICD-10-CM | POA: Insufficient documentation

## 2021-02-07 DIAGNOSIS — O99711 Diseases of the skin and subcutaneous tissue complicating pregnancy, first trimester: Secondary | ICD-10-CM

## 2021-02-07 DIAGNOSIS — Z79899 Other long term (current) drug therapy: Secondary | ICD-10-CM | POA: Insufficient documentation

## 2021-02-07 DIAGNOSIS — Z3401 Encounter for supervision of normal first pregnancy, first trimester: Secondary | ICD-10-CM

## 2021-02-07 DIAGNOSIS — L292 Pruritus vulvae: Secondary | ICD-10-CM

## 2021-02-07 DIAGNOSIS — B373 Candidiasis of vulva and vagina: Secondary | ICD-10-CM | POA: Insufficient documentation

## 2021-02-07 DIAGNOSIS — B3731 Acute candidiasis of vulva and vagina: Secondary | ICD-10-CM

## 2021-02-07 LAB — WET PREP, GENITAL
Clue Cells Wet Prep HPF POC: NONE SEEN
Sperm: NONE SEEN
Trich, Wet Prep: NONE SEEN

## 2021-02-07 MED ORDER — TERCONAZOLE 0.4 % VA CREA: 1.0000 | TOPICAL_CREAM | Freq: Every day | VAGINAL | 0 refills | Status: DC

## 2021-02-07 NOTE — Discharge Instructions (Signed)
Vaginal Yeast Infection, Adult  Vaginal yeast infection is a condition that causes vaginal discharge as well as soreness, swelling, and redness (inflammation) of the vagina. This is a common condition. Some women get this infection frequently. What are the causes? This condition is caused by a change in the normal balance of the yeast (candida) and bacteria that live in the vagina. This change causes an overgrowth of yeast, which causes the inflammation. What increases the risk? The condition is more likely to develop in women who:  Take antibiotic medicines.  Have diabetes.  Take birth control pills.  Are pregnant.  Douche often.  Have a weak body defense system (immune system).  Have been taking steroid medicines for a long time.  Frequently wear tight clothing. What are the signs or symptoms? Symptoms of this condition include:  White, thick, creamy vaginal discharge.  Swelling, itching, redness, and irritation of the vagina. The lips of the vagina (vulva) may be affected as well.  Pain or a burning feeling while urinating.  Pain during sex. How is this diagnosed? This condition is diagnosed based on:  Your medical history.  A physical exam.  A pelvic exam. Your health care provider will examine a sample of your vaginal discharge under a microscope. Your health care provider may send this sample for testing to confirm the diagnosis. How is this treated? This condition is treated with medicine. Medicines may be over-the-counter or prescription. You may be told to use one or more of the following:  Medicine that is taken by mouth (orally).  Medicine that is applied as a cream (topically).  Medicine that is inserted directly into the vagina (suppository). Follow these instructions at home: Lifestyle  Do not have sex until your health care provider approves. Tell your sex partner that you have a yeast infection. That person should go to his or her health care  provider and ask if they should also be treated.  Do not wear tight clothes, such as pantyhose or tight pants.  Wear breathable cotton underwear. General instructions  Take or apply over-the-counter and prescription medicines only as told by your health care provider.  Eat more yogurt. This may help to keep your yeast infection from returning.  Do not use tampons until your health care provider approves.  Try taking a sitz bath to help with discomfort. This is a warm water bath that is taken while you are sitting down. The water should only come up to your hips and should cover your buttocks. Do this 3-4 times per day or as told by your health care provider.  Do not douche.  If you have diabetes, keep your blood sugar levels under control.  Keep all follow-up visits as told by your health care provider. This is important.   Contact a health care provider if:  You have a fever.  Your symptoms go away and then return.  Your symptoms do not get better with treatment.  Your symptoms get worse.  You have new symptoms.  You develop blisters in or around your vagina.  You have blood coming from your vagina and it is not your menstrual period.  You develop pain in your abdomen. Summary  Vaginal yeast infection is a condition that causes discharge as well as soreness, swelling, and redness (inflammation) of the vagina.  This condition is treated with medicine. Medicines may be over-the-counter or prescription.  Take or apply over-the-counter and prescription medicines only as told by your health care provider.  Do not   douche. Do not have sex or use tampons until your health care provider approves.  Contact a health care provider if your symptoms do not get better with treatment or your symptoms go away and then return. This information is not intended to replace advice given to you by your health care provider. Make sure you discuss any questions you have with your health care  provider. Document Revised: 04/30/2019 Document Reviewed: 02/16/2018 Elsevier Patient Education  2021 [employer]  

## 2021-02-07 NOTE — MAU Note (Addendum)
Main lab called to state they were unable to run the Wet Prep as one of their staff members wrongfully sent it the sample to Cytology and it is now out of the time frame.

## 2021-02-07 NOTE — MAU Note (Signed)
...  Katrina Cain is a 25 y.o. at [redacted]w[redacted]d here in MAU reporting: Patient states "three weeks to one month ago I was treated for Chlamydia. Soon after I started having diarrhea so I was using Preparation H wipes but I also used them to wipe my vagina and immediately after my vagina began to itch. She states last week she states her vaginal itching started to get worse and states her vulva is itching. She reports she is unable to sleep due to the amount of itching. No increase in vaginal discharge, no abnormal discharge color or odor.   FHT: 163 doppler

## 2021-02-07 NOTE — MAU Note (Signed)
Recollected Wet Prep sample and sent to Main Lab at 0831. Called Thomes Cake in lab to inform her sample was being sent.

## 2021-02-07 NOTE — MAU Provider Note (Addendum)
Chief Complaint:  No chief complaint on file.   Event Date/Time   First Provider Initiated Contact with Patient 02/07/21 [phone]     HPI: Katrina Cain is a 25 y.o. G1P0000 at 61w1dho presents to maternity admissions reporting vaginal irritation.  Recently treated for Chlamydia and UTI.  Just finished antibiotic a week ago.  Other tx 2 wks ago.   Used PrepH wipes on vulva. . She denies LOF, vaginal bleeding, urinary symptoms, h/a, dizziness, n/v, diarrhea, constipation or fever/chills.   Vaginal Discharge The patient's primary symptoms include genital itching and vaginal discharge. The patient's pertinent negatives include no genital lesions, genital odor, pelvic pain or vaginal bleeding. This is a new problem. The current episode started in the past 7 days. The problem occurs constantly. The problem has been unchanged. She is pregnant. Pertinent negatives include no abdominal pain, back pain, chills, constipation, diarrhea, fever or nausea. The vaginal discharge was milky. There has been no bleeding. She has not been passing tissue. Nothing aggravates the symptoms. Treatments tried: Prep H wipes. The treatment provided no relief.    RN Note: Katrina KHUNis a 25y.o. at 148w1dere in MAU reporting: Patient states "three weeks to one month ago I was treated for Chlamydia. Soon after I started having diarrhea so I was using Preparation H wipes but I also used them to wipe my vagina and immediately after my vagina began to itch. She states last week she states her vaginal itching started to get worse and states her vulva is itching. She reports she is unable to sleep due to the amount of itching. No increase in vaginal discharge, no abnormal discharge color or odor.   FHT: 163 doppler  Past Medical History: Past Medical History:  Diagnosis Date   Asthma     Past obstetric history: OB History  Gravida Para Term Preterm AB Living  1 0 0 0 0 0  SAB IAB Ectopic Multiple Live Births  0 0 0  0 0    # Outcome Date GA Lbr Len/2nd Weight Sex Delivery Anes PTL Lv  1 Current             Past Surgical History: No past surgical history on file.  Family History: Family History  Problem Relation Age of Onset   Diabetes Mother    Hypertension Mother    Cancer Father    Cancer Paternal Grandmother     Social History: Social History   Tobacco Use   Smoking status: Never Smoker   Smokeless tobacco: Never Used  VaScientific laboratory technicianse: Never used  Substance Use Topics   Alcohol use: No   Drug use: Not Currently    Types: Marijuana    Comment: Stopped when pregnancy confirmed    Allergies: No Known Allergies  Meds:  Medications Prior to Admission  Medication Sig Dispense Refill Last Dose   amoxicillin-clavulanate (AUGMENTIN) 875-125 MG tablet Take 1 tablet by mouth 2 (two) times daily. 14 tablet 0    Blood Pressure Monitoring (BLOOD PRESSURE KIT) DEVI 1 kit by Does not apply route once a week. 1 each 0    Doxylamine-Pyridoxine (DICLEGIS) 10-10 MG TBEC Take 2 tablets by mouth at bedtime. If symptoms persist, add one tablet in the morning and one in the afternoon 100 tablet 5    metroNIDAZOLE (FLAGYL) 500 MG tablet Take 1 tablet (500 mg total) by mouth 2 (two) times daily. 14 tablet 0    Prenat-Fe Carbonyl-FA-Omega 3 (ONE-A-DAY  WOMENS PRENATAL 1 PO) Take by mouth.       I have reviewed patient's Past Medical Hx, Surgical Hx, Family Hx, Social Hx, medications and allergies.   ROS:  Review of Systems  Constitutional: Negative for chills and fever.  Gastrointestinal: Negative for abdominal pain, constipation, diarrhea and nausea.  Genitourinary: Positive for vaginal discharge. Negative for pelvic pain.  Musculoskeletal: Negative for back pain.   Other systems negative  Physical Exam  No data found. Constitutional: Well-developed, well-nourished female in no acute distress.  Cardiovascular: normal rate and rhythm Respiratory: normal effort, clear to auscultation  bilaterally GI: Abd soft, non-tender, gravid appropriate for gestational age.   No rebound or guarding. MS: Extremities nontender, no edema, normal ROM Neurologic: Alert and oriented x 4.  GU: Neg CVAT.  PELVIC EXAM: scant white creamy discharge, vaginal walls and external genitalia normal  FHT:  163   Labs: Results for orders placed or performed during the hospital encounter of 02/07/21 (from the past 24 hour(s))  Wet prep, genital     Status: Abnormal   Collection Time: 02/07/21  8:30 AM  Result Value Ref Range   Yeast Wet Prep HPF POC PRESENT (A) NONE SEEN   Trich, Wet Prep NONE SEEN NONE SEEN   Clue Cells Wet Prep HPF POC NONE SEEN NONE SEEN   WBC, Wet Prep HPF POC MODERATE (A) NONE SEEN   Sperm NONE SEEN    AB/Positive/-- (04/11 1434)  Imaging:    MAU Course/MDM: I have ordered wet prep.  Too early to do TOC on Chlamydia or urine .    Assessment: Single IUP at 85w1dVulvar itching and irritation  Plan: Awaiting results Report given to oncoming provider  MHansel FeinsteinCNM, MSN Certified Nurse-Midwife 02/07/2021 7:19 AM  Reassessment (8:56 AM) Results return positive for yeast  Rx sent to pharmacy on file Patient to follow up as scheduled. Encouraged to call or return to MAU if symptoms worsen or with the onset of new symptoms. Discharged to home  JMaryann ConnersMSN, CNesika BeachProvider, Center for [facility]

## 2021-02-26 ENCOUNTER — Ambulatory Visit (INDEPENDENT_AMBULATORY_CARE_PROVIDER_SITE_OTHER): Payer: Medicaid Other | Admitting: Advanced Practice Midwife

## 2021-02-26 ENCOUNTER — Other Ambulatory Visit (HOSPITAL_COMMUNITY)
Admission: RE | Admit: 2021-02-26 | Discharge: 2021-02-26 | Disposition: A | Discharge status: Home / Self Care | Payer: Medicaid Other | Source: Ambulatory Visit | Attending: Advanced Practice Midwife | Admitting: Advanced Practice Midwife

## 2021-02-26 ENCOUNTER — Other Ambulatory Visit: Payer: Self-pay

## 2021-02-26 VITALS — BP 116/74 | HR 81 | Wt 153.0 lb

## 2021-02-26 DIAGNOSIS — O98811 Other maternal infectious and parasitic diseases complicating pregnancy, first trimester: Secondary | ICD-10-CM

## 2021-02-26 DIAGNOSIS — R42 Dizziness and giddiness: Secondary | ICD-10-CM

## 2021-02-26 DIAGNOSIS — Z3401 Encounter for supervision of normal first pregnancy, first trimester: Secondary | ICD-10-CM | POA: Insufficient documentation

## 2021-02-26 DIAGNOSIS — O09811 Supervision of pregnancy resulting from assisted reproductive technology, first trimester: Secondary | ICD-10-CM | POA: Insufficient documentation

## 2021-02-26 DIAGNOSIS — A749 Chlamydial infection, unspecified: Secondary | ICD-10-CM

## 2021-02-26 DIAGNOSIS — R630 Anorexia: Secondary | ICD-10-CM

## 2021-02-26 DIAGNOSIS — Z3A15 15 weeks gestation of pregnancy: Secondary | ICD-10-CM

## 2021-02-26 MED ORDER — METOCLOPRAMIDE HCL 10 MG PO TABS: 10.0000 mg | ORAL_TABLET | Freq: Three times a day (TID) | ORAL | 2 refills | Status: DC | PRN

## 2021-02-26 NOTE — Progress Notes (Signed)
   PRENATAL VISIT NOTE  Subjective:  Katrina Cain is a 25 y.o. G1P0000 at [redacted]w[redacted]d being seen today for ongoing prenatal care.  She is currently monitored for the following issues for this low-risk pregnancy and has Encounter for supervision of normal first pregnancy in first trimester on their problem list.  Patient reports low appetite, occasional dizziness.  Contractions: Not present. Vag. Bleeding: None.  Movement: Present. Denies leaking of fluid.   The following portions of the patient's history were reviewed and updated as appropriate: allergies, current medications, past family history, past medical history, past social history, past surgical history and problem list.   Objective:   Vitals:   02/26/21 1511  BP: 116/74  Pulse: 81  Weight: 153 lb (69.4 kg)    Fetal Status: Fetal Heart Rate (bpm): 155   Movement: Present     General:  Alert, oriented and cooperative. Patient is in no acute distress.  Skin: Skin is warm and dry. No rash noted.   Cardiovascular: Normal heart rate noted  Respiratory: Normal respiratory effort, no problems with respiration noted  Abdomen: Soft, gravid, appropriate for gestational age.  Pain/Pressure: Absent     Pelvic: Cervical exam deferred        Extremities: Normal range of motion.     Mental Status: Normal mood and affect. Normal behavior. Normal judgment and thought content.   Assessment and Plan:  Pregnancy: G1P0000 at [redacted]w[redacted]d 1. Encounter for supervision of normal first pregnancy in first trimester --Anticipatory guidance about next visits/weeks of pregnancy given. --next visit in 4 weeks  - Babyscripts Schedule Optimization  2. Poor appetite --Very little nausea, taking Diclegis rarely but still does not have appetite --Reviewed options for more nutrient dense foods, liquid calories like smoothies/protein shakes, etc.   --Rx for Reglan for pt to try TID PRN  3. [redacted] weeks gestation of pregnancy   4. Dizziness --Likely related to  poor appetite, need to eat more frequently  5. Chlamydia infection affecting pregnancy in first trimester --TOC today  Preterm labor symptoms and general obstetric precautions including but not limited to vaginal bleeding, contractions, leaking of fluid and fetal movement were reviewed in detail with the patient. Please refer to After Visit Summary for other counseling recommendations.   No follow-ups on file.  Future Appointments  Date Time Provider Department Center  03/20/2021  2:15 PM [facility]  03/26/2021  2:30 PM Leftwich-Kirby, Wilmer Floor, CNM [facility] None    Sharen Counter, CNM

## 2021-02-26 NOTE — Progress Notes (Signed)
Pt does not have much appetite.  Pt is getting good fluid intake.  Pt is concerned this may be causing her to have some dizziness.

## 2021-02-27 LAB — CERVICOVAGINAL ANCILLARY ONLY
Chlamydia: NEGATIVE
Comment: NEGATIVE
Comment: NORMAL
Neisseria Gonorrhea: NEGATIVE

## 2021-02-28 ENCOUNTER — Encounter: Payer: Self-pay | Admitting: Advanced Practice Midwife

## 2021-02-28 DIAGNOSIS — R87613 High grade squamous intraepithelial lesion on cytologic smear of cervix (HGSIL): Secondary | ICD-10-CM | POA: Insufficient documentation

## 2021-02-28 LAB — AFP, SERUM, OPEN SPINA BIFIDA
AFP MoM: 1.34
AFP Value: 49.8 ng/mL
Gest. Age on Collection Date: 15.9 weeks
Maternal Age At EDD: 27 yr
OSBR Risk 1 IN: 8546
Test Results:: NEGATIVE
Weight: 153 [lb_av]

## 2021-03-13 ENCOUNTER — Other Ambulatory Visit: Payer: Self-pay

## 2021-03-13 ENCOUNTER — Inpatient Hospital Stay (HOSPITAL_COMMUNITY)
Admission: AD | Admit: 2021-03-13 | Discharge: 2021-03-13 | Disposition: A | Discharge status: Home / Self Care | Payer: Medicaid Other | Attending: Obstetrics and Gynecology | Admitting: Obstetrics and Gynecology

## 2021-03-13 DIAGNOSIS — Z3401 Encounter for supervision of normal first pregnancy, first trimester: Secondary | ICD-10-CM

## 2021-03-13 DIAGNOSIS — B373 Candidiasis of vulva and vagina: Secondary | ICD-10-CM | POA: Diagnosis not present

## 2021-03-13 DIAGNOSIS — N898 Other specified noninflammatory disorders of vagina: Secondary | ICD-10-CM | POA: Diagnosis present

## 2021-03-13 DIAGNOSIS — O98812 Other maternal infectious and parasitic diseases complicating pregnancy, second trimester: Secondary | ICD-10-CM | POA: Diagnosis not present

## 2021-03-13 DIAGNOSIS — B3731 Acute candidiasis of vulva and vagina: Secondary | ICD-10-CM

## 2021-03-13 DIAGNOSIS — Z3A18 18 weeks gestation of pregnancy: Secondary | ICD-10-CM | POA: Insufficient documentation

## 2021-03-13 LAB — URINALYSIS, ROUTINE W REFLEX MICROSCOPIC
Bilirubin Urine: NEGATIVE
Glucose, UA: NEGATIVE mg/dL
Hgb urine dipstick: NEGATIVE
Ketones, ur: NEGATIVE mg/dL
Nitrite: NEGATIVE
Protein, ur: NEGATIVE mg/dL
Specific Gravity, Urine: 1.02 (ref 1.005–1.030)
pH: 8 (ref 5.0–8.0)

## 2021-03-13 LAB — WET PREP, GENITAL
Clue Cells Wet Prep HPF POC: NONE SEEN
Sperm: NONE SEEN
Trich, Wet Prep: NONE SEEN

## 2021-03-13 MED ORDER — TERCONAZOLE 0.4 % VA CREA: 1.0000 | TOPICAL_CREAM | Freq: Every day | VAGINAL | 0 refills | Status: DC

## 2021-03-13 NOTE — MAU Provider Note (Signed)
History     CSN: [id number]  Arrival date and time: 03/13/21 6237   Event Date/Time   First Provider Initiated Contact with Patient 03/13/21 [phone]      Chief Complaint  Patient presents with  . Vaginal Itching  . Vaginal Irritation   Katrina Cain is a 25 y.o. year old G67P0000 female at 31w0dweeks gestation who presents to MAU reporting vaginal itching and irritation since the night of 03/08/2021. She reports she noticed an increase in vaginal d/c, but no odor. She denies any VB. [facility]19/[facility].   OB History    Gravida  1   Para  0   Term  0   Preterm  0   AB  0   Living  0     SAB  0   IAB  0   Ectopic  0   Multiple  0   Live Births  0           Past Medical History:  Diagnosis Date  . Asthma     No past surgical history on file.  Family History  Problem Relation Age of Onset  . Diabetes Mother   . Hypertension Mother   . Cancer Father   . Cancer Paternal Grandmother     Social History   Tobacco Use  . Smoking status: Never Smoker  . Smokeless tobacco: Never Used  Vaping Use  . Vaping Use: Never used  Substance Use Topics  . Alcohol use: No  . Drug use: Not Currently    Types: Marijuana    Comment: Stopped when pregnancy confirmed    Allergies: No Known Allergies  Medications Prior to Admission  Medication Sig Dispense Refill Last Dose  . Blood Pressure Monitoring (BLOOD PRESSURE KIT) DEVI 1 kit by Does not apply route once a week. 1 each 0   . Doxylamine-Pyridoxine (DICLEGIS) 10-10 MG TBEC Take 2 tablets by mouth at bedtime. If symptoms persist, add one tablet in the morning and one in the afternoon 100 tablet 5   . metoCLOPramide (REGLAN) 10 MG tablet Take 1 tablet (10 mg total) by mouth 3 (three) times daily with meals as needed for nausea. 30 tablet 2   . Prenat-Fe Carbonyl-FA-Omega 3 (ONE-A-DAY WOMENS PRENATAL 1 PO) Take by mouth.     . terconazole (TERAZOL 7) 0.4 % vaginal cream Place  1 applicator vaginally at bedtime. 45 g 0     Review of Systems  Constitutional: Negative.   HENT: Negative.   Eyes: Negative.   Respiratory: Negative.   Cardiovascular: Negative.   Gastrointestinal: Negative.   Endocrine: Negative.   Genitourinary: Positive for vaginal discharge (itching and itrritation).  Musculoskeletal: Negative.   Skin: Negative.   Allergic/Immunologic: Negative.   Neurological: Negative.   Hematological: Negative.   Psychiatric/Behavioral: Negative.    Physical Exam   Blood pressure 109/69, pulse 91, temperature 98.2 F (36.8 C), temperature source Oral, resp. rate 18, height 5' 8"  (1.727 m), weight 71.8 kg, last menstrual period 11/07/2020, SpO2 100 %.  Physical Exam Vitals and nursing note reviewed.  Constitutional:      Appearance: Normal appearance. She is normal weight.  Cardiovascular:     Rate and Rhythm: Normal rate.  Pulmonary:     Effort: Pulmonary effort is normal.  Genitourinary:    General: Normal vulva.     Comments: Wet prep and GC/CT obtained by self-swab Musculoskeletal:        General:  Normal range of motion.  Skin:    General: Skin is warm and dry.  Neurological:     Mental Status: She is alert and oriented to person, place, and time.  Psychiatric:        Mood and Affect: Mood normal.        Behavior: Behavior normal.        Thought Content: Thought content normal.        Judgment: Judgment normal.     MAU Course  Procedures  MDM Wet Prep GC/CT -- Results pending  Results for orders placed or performed during the hospital encounter of 03/13/21 (from the past 24 hour(s))  Urinalysis, Routine w reflex microscopic Urine, Clean Catch     Status: Abnormal   Collection Time: 03/13/21  7:56 AM  Result Value Ref Range   Color, Urine YELLOW YELLOW   APPearance CLOUDY (A) CLEAR   Specific Gravity, Urine 1.020 1.005 - 1.030   pH 8.0 5.0 - 8.0   Glucose, UA NEGATIVE NEGATIVE mg/dL   Hgb urine dipstick NEGATIVE NEGATIVE    Bilirubin Urine NEGATIVE NEGATIVE   Ketones, ur NEGATIVE NEGATIVE mg/dL   Protein, ur NEGATIVE NEGATIVE mg/dL   Nitrite NEGATIVE NEGATIVE   Leukocytes,Ua MODERATE (A) NEGATIVE   RBC / HPF 0-5 0 - 5 RBC/hpf   WBC, UA 6-10 0 - 5 WBC/hpf   Bacteria, UA RARE (A) NONE SEEN   Squamous Epithelial / LPF 0-5 0 - 5   Mucus PRESENT    Amorphous Crystal PRESENT   Wet prep, genital     Status: Abnormal   Collection Time: 03/13/21  8:37 AM   Specimen: PATH Cytology Cervicovaginal Ancillary Only  Result Value Ref Range   Yeast Wet Prep HPF POC PRESENT (A) NONE SEEN   Trich, Wet Prep NONE SEEN NONE SEEN   Clue Cells Wet Prep HPF POC NONE SEEN NONE SEEN   WBC, Wet Prep HPF POC MANY (A) NONE SEEN   Sperm NONE SEEN     Assessment and Plan  Candida vaginitis  - Information provided on vaginal yeast infection - Rx for Terazol cream vaginally x 7 days   [redacted] weeks gestation of pregnancy   - Discharge patient - Keep scheduled appt on 03/19/2021 - Patient verbalized an understanding of the plan of care and agrees.    Laury Deep, CNM 03/13/2021, 9:55 AM

## 2021-03-13 NOTE — MAU Note (Signed)
Presents with c/o vaginal itching and irritation.  States has increased discharge but no odor noted.  Denies VB.

## 2021-03-13 NOTE — MAU Note (Signed)
Pt. Self swabbed 

## 2021-03-14 LAB — GC/CHLAMYDIA PROBE AMP (~~LOC~~) NOT AT ARMC
Chlamydia: NEGATIVE
Comment: NEGATIVE
Comment: NORMAL
Neisseria Gonorrhea: NEGATIVE

## 2021-03-19 ENCOUNTER — Encounter: Payer: Self-pay | Admitting: Family Medicine

## 2021-03-20 ENCOUNTER — Other Ambulatory Visit: Payer: Self-pay

## 2021-03-20 ENCOUNTER — Other Ambulatory Visit: Payer: Self-pay | Admitting: Obstetrics

## 2021-03-20 ENCOUNTER — Ambulatory Visit: Payer: Medicaid Other | Attending: Obstetrics

## 2021-03-20 DIAGNOSIS — Z3401 Encounter for supervision of normal first pregnancy, first trimester: Secondary | ICD-10-CM

## 2021-03-21 ENCOUNTER — Other Ambulatory Visit: Payer: Self-pay | Admitting: *Deleted

## 2021-03-21 DIAGNOSIS — O283 Abnormal ultrasonic finding on antenatal screening of mother: Secondary | ICD-10-CM

## 2021-03-26 ENCOUNTER — Encounter: Payer: Medicaid Other | Admitting: Advanced Practice Midwife

## 2021-03-28 ENCOUNTER — Other Ambulatory Visit (HOSPITAL_COMMUNITY)
Admission: RE | Admit: 2021-03-28 | Discharge: 2021-03-28 | Disposition: A | Discharge status: Home / Self Care | Payer: Medicaid Other | Source: Ambulatory Visit | Attending: Obstetrics | Admitting: Obstetrics

## 2021-03-28 ENCOUNTER — Other Ambulatory Visit: Payer: Self-pay

## 2021-03-28 ENCOUNTER — Encounter: Payer: Self-pay | Admitting: Obstetrics

## 2021-03-28 ENCOUNTER — Ambulatory Visit (INDEPENDENT_AMBULATORY_CARE_PROVIDER_SITE_OTHER): Payer: Medicaid Other | Admitting: Obstetrics

## 2021-03-28 VITALS — Wt 160.2 lb

## 2021-03-28 DIAGNOSIS — N898 Other specified noninflammatory disorders of vagina: Secondary | ICD-10-CM | POA: Diagnosis not present

## 2021-03-28 DIAGNOSIS — B3731 Acute candidiasis of vulva and vagina: Secondary | ICD-10-CM

## 2021-03-28 DIAGNOSIS — B373 Candidiasis of vulva and vagina: Secondary | ICD-10-CM

## 2021-03-28 DIAGNOSIS — D069 Carcinoma in situ of cervix, unspecified: Secondary | ICD-10-CM

## 2021-03-28 DIAGNOSIS — Z3401 Encounter for supervision of normal first pregnancy, first trimester: Secondary | ICD-10-CM

## 2021-03-28 MED ORDER — TERCONAZOLE 0.4 % VA CREA: 1.0000 | TOPICAL_CREAM | Freq: Every day | VAGINAL | 0 refills | Status: DC

## 2021-03-28 NOTE — Progress Notes (Signed)
Subjective:  Katrina Cain is a 25 y.o. G1P0000 at [redacted]w[redacted]d being seen today for ongoing prenatal care.  She is currently monitored for the following issues for this low-risk pregnancy and has Encounter for supervision of normal first pregnancy in first trimester and HSIL (high grade squamous intraepithelial lesion) on Pap smear of cervix on their problem list.  Patient reports vaginal irritation.  Contractions: Not present. Vag. Bleeding: None.  Movement: Present. Denies leaking of fluid.   The following portions of the patient's history were reviewed and updated as appropriate: allergies, current medications, past family history, past medical history, past social history, past surgical history and problem list. Problem list updated.  Objective:   Vitals:   03/28/21 1433  Weight: 160 lb 3.2 oz (72.7 kg)    Fetal Status:     Movement: Present     General:  Alert, oriented and cooperative. Patient is in no acute distress.  Skin: Skin is warm and dry. No rash noted.   Cardiovascular: Normal heart rate noted  Respiratory: Normal respiratory effort, no problems with respiration noted  Abdomen: Soft, gravid, appropriate for gestational age. Pain/Pressure: Present     Pelvic:  Cervical exam deferred        Extremities: Normal range of motion.  Edema: None  Mental Status: Normal mood and affect. Normal behavior. Normal judgment and thought content.   Urinalysis:      Assessment and Plan:  Pregnancy: G1P0000 at [redacted]w[redacted]d  1. Encounter for supervision of normal first pregnancy in first trimester  2. Vaginal discharge Rx: - Cervicovaginal ancillary only( [place])  3. Candida vaginitis Rx: - terconazole (TERAZOL 7) 0.4 % vaginal cream; Place 1 applicator vaginally at bedtime.  Dispense: 45 g; Refill: 0  4. Severe dysplasia of cervix (CIN III) - needs LEEP procedure done 3-4 months postpartum   Preterm labor symptoms and general obstetric precautions including but not limited to  vaginal bleeding, contractions, leaking of fluid and fetal movement were reviewed in detail with the patient. Please refer to After Visit Summary for other counseling recommendations.   Return in about 2 weeks (around 04/11/2021) for ROB.   Brock Bad, MD  03/28/21

## 2021-03-28 NOTE — Progress Notes (Signed)
ROB 20.[redacted] wks GA. Reports concern for recurrent yeast infection. No discharge, just vaginal irritation.

## 2021-03-29 LAB — CERVICOVAGINAL ANCILLARY ONLY
Bacterial Vaginitis (gardnerella): NEGATIVE
Candida Glabrata: NEGATIVE
Candida Vaginitis: NEGATIVE
Comment: NEGATIVE
Comment: NEGATIVE
Comment: NEGATIVE
Comment: NEGATIVE
Trichomonas: NEGATIVE

## 2021-04-02 ENCOUNTER — Telehealth: Payer: Self-pay | Admitting: Family Medicine

## 2021-04-02 NOTE — Telephone Encounter (Signed)
Patient requested a call about her blood pressure. No answer. MyChart message sent.

## 2021-04-11 ENCOUNTER — Other Ambulatory Visit: Payer: Self-pay

## 2021-04-11 ENCOUNTER — Encounter: Payer: Self-pay | Admitting: Family Medicine

## 2021-04-11 ENCOUNTER — Ambulatory Visit (INDEPENDENT_AMBULATORY_CARE_PROVIDER_SITE_OTHER): Payer: Medicaid Other | Admitting: Family Medicine

## 2021-04-11 VITALS — BP 116/78 | HR 89 | Wt 153.3 lb

## 2021-04-11 DIAGNOSIS — Z3401 Encounter for supervision of normal first pregnancy, first trimester: Secondary | ICD-10-CM

## 2021-04-11 NOTE — Progress Notes (Signed)
   Subjective:  Katrina Cain is a 25 y.o. G1P0000 at [redacted]w[redacted]d being seen today for ongoing prenatal care.  She is currently monitored for the following issues for this low-risk pregnancy and has Encounter for supervision of normal first pregnancy in first trimester and HSIL (high grade squamous intraepithelial lesion) on Pap smear of cervix on their problem list.  Patient reports no complaints.  Contractions: Not present. Vag. Bleeding: None.  Movement: Present. Denies leaking of fluid.   The following portions of the patient's history were reviewed and updated as appropriate: allergies, current medications, past family history, past medical history, past social history, past surgical history and problem list. Problem list updated.  Objective:   Vitals:   04/11/21 1310  BP: 116/78  Pulse: 89  Weight: 153 lb 4.8 oz (69.5 kg)    Fetal Status: Fetal Heart Rate (bpm): 158 Fundal Height: 22 cm Movement: Present     General:  Alert, oriented and cooperative. Patient is in no acute distress.  Skin: Skin is warm and dry. No rash noted.   Cardiovascular: Normal heart rate noted  Respiratory: Normal respiratory effort, no problems with respiration noted  Abdomen: Soft, gravid, appropriate for gestational age. Pain/Pressure: Present     Pelvic: Vag. Bleeding: None     Cervical exam deferred        Extremities: Normal range of motion.  Edema: None  Mental Status: Normal mood and affect. Normal behavior. Normal judgment and thought content.   Urinalysis:      Assessment and Plan:  Pregnancy: G1P0000 at [redacted]w[redacted]d  1. Encounter for supervision of normal first pregnancy in first trimester BP and FHR normal Stable Discussed third trimester labs next visit  Preterm labor symptoms and general obstetric precautions including but not limited to vaginal bleeding, contractions, leaking of fluid and fetal movement were reviewed in detail with the patient. Please refer to After Visit Summary for other  counseling recommendations.  Return in 5 weeks (on 05/16/2021) for ob visit, [facility].   Venora Maples, MD

## 2021-04-11 NOTE — Patient Instructions (Signed)

## 2021-04-11 NOTE — Progress Notes (Signed)
ROB 22.1wks Reports occasional "lightening" like pain in vagina.

## 2021-04-18 ENCOUNTER — Ambulatory Visit: Payer: Medicaid Other | Attending: Maternal & Fetal Medicine

## 2021-04-18 ENCOUNTER — Other Ambulatory Visit: Payer: Self-pay

## 2021-04-18 ENCOUNTER — Ambulatory Visit: Payer: Medicaid Other | Admitting: *Deleted

## 2021-04-18 VITALS — BP 118/75 | HR 96

## 2021-04-18 DIAGNOSIS — O358XX0 Maternal care for other (suspected) fetal abnormality and damage, not applicable or unspecified: Secondary | ICD-10-CM

## 2021-04-18 DIAGNOSIS — Z362 Encounter for other antenatal screening follow-up: Secondary | ICD-10-CM

## 2021-04-18 DIAGNOSIS — Z3401 Encounter for supervision of normal first pregnancy, first trimester: Secondary | ICD-10-CM | POA: Diagnosis not present

## 2021-04-18 DIAGNOSIS — Z3A22 22 weeks gestation of pregnancy: Secondary | ICD-10-CM | POA: Diagnosis not present

## 2021-04-18 DIAGNOSIS — O283 Abnormal ultrasonic finding on antenatal screening of mother: Secondary | ICD-10-CM | POA: Diagnosis not present

## 2021-04-18 DIAGNOSIS — O321XX0 Maternal care for breech presentation, not applicable or unspecified: Secondary | ICD-10-CM

## 2021-05-09 ENCOUNTER — Other Ambulatory Visit: Payer: Medicaid Other

## 2021-05-09 ENCOUNTER — Encounter: Payer: Self-pay | Admitting: Obstetrics

## 2021-05-09 ENCOUNTER — Other Ambulatory Visit: Payer: Self-pay

## 2021-05-09 ENCOUNTER — Ambulatory Visit (INDEPENDENT_AMBULATORY_CARE_PROVIDER_SITE_OTHER): Payer: Medicaid Other | Admitting: Obstetrics

## 2021-05-09 VITALS — BP 131/89 | HR 109 | Wt 158.0 lb

## 2021-05-09 DIAGNOSIS — Z3400 Encounter for supervision of normal first pregnancy, unspecified trimester: Secondary | ICD-10-CM

## 2021-05-09 NOTE — Progress Notes (Signed)
Pt presents for ROB and 2 gtt labs. Pt forgot she had cranberry juice this morning.

## 2021-05-09 NOTE — Progress Notes (Signed)
Subjective:  Katrina Cain is a 25 y.o. G1P0000 at [redacted]w[redacted]d being seen today for ongoing prenatal care.  She is currently monitored for the following issues for this low-risk pregnancy and has Encounter for supervision of normal first pregnancy in first trimester and HSIL (high grade squamous intraepithelial lesion) on Pap smear of cervix on their problem list.  Patient reports no complaints.   .  .   . Denies leaking of fluid.   The following portions of the patient's history were reviewed and updated as appropriate: allergies, current medications, past family history, past medical history, past social history, past surgical history and problem list. Problem list updated.  Objective:  There were no vitals filed for this visit.  Fetal Status:           General:  Alert, oriented and cooperative. Patient is in no acute distress.  Skin: Skin is warm and dry. No rash noted.   Cardiovascular: Normal heart rate noted  Respiratory: Normal respiratory effort, no problems with respiration noted  Abdomen: Soft, gravid, appropriate for gestational age.       Pelvic:  Cervical exam deferred        Extremities: Normal range of motion.     Mental Status: Normal mood and affect. Normal behavior. Normal judgment and thought content.   Urinalysis:      Assessment and Plan:  Pregnancy: G1P0000 at [redacted]w[redacted]d  1. Encounter for supervision of normal first pregnancy in first trimester Rx: - Glucose Tolerance, 2 Hours w/1 Hour - HIV Antibody (routine testing w rflx) - RPR - CBC  Preterm labor symptoms and general obstetric precautions including but not limited to vaginal bleeding, contractions, leaking of fluid and fetal movement were reviewed in detail with the patient. Please refer to After Visit Summary for other counseling recommendations.   Return in about 2 weeks (around 05/23/2021) for ROB.   Brock Bad, MD  05/09/21

## 2021-05-11 ENCOUNTER — Other Ambulatory Visit: Payer: Medicaid Other

## 2021-05-11 ENCOUNTER — Other Ambulatory Visit: Payer: Self-pay

## 2021-05-11 DIAGNOSIS — Z3400 Encounter for supervision of normal first pregnancy, unspecified trimester: Secondary | ICD-10-CM | POA: Diagnosis not present

## 2021-05-12 LAB — HIV ANTIBODY (ROUTINE TESTING W REFLEX): HIV Screen 4th Generation wRfx: NONREACTIVE

## 2021-05-12 LAB — CBC
Hematocrit: 35.9 % (ref 34.0–46.6)
Hemoglobin: 12.3 g/dL (ref 11.1–15.9)
MCH: 30.8 pg (ref 26.6–33.0)
MCHC: 34.3 g/dL (ref 31.5–35.7)
MCV: 90 fL (ref 79–97)
Platelets: 256 10*3/uL (ref 150–450)
RBC: 3.99 x10E6/uL (ref 3.77–5.28)
RDW: 12.6 % (ref 11.7–15.4)
WBC: 6.5 10*3/uL (ref 3.4–10.8)

## 2021-05-12 LAB — GLUCOSE TOLERANCE, 2 HOURS W/ 1HR
Glucose, 1 hour: 123 mg/dL (ref 65–179)
Glucose, 2 hour: 100 mg/dL (ref 65–152)
Glucose, Fasting: 83 mg/dL (ref 65–91)

## 2021-05-12 LAB — RPR: RPR Ser Ql: NONREACTIVE

## 2021-05-24 ENCOUNTER — Encounter: Payer: Self-pay | Admitting: Obstetrics

## 2021-05-24 ENCOUNTER — Ambulatory Visit (INDEPENDENT_AMBULATORY_CARE_PROVIDER_SITE_OTHER): Payer: Medicaid Other | Admitting: Obstetrics

## 2021-05-24 ENCOUNTER — Other Ambulatory Visit: Payer: Self-pay

## 2021-05-24 VITALS — BP 125/72 | HR 93 | Wt 160.3 lb

## 2021-05-24 DIAGNOSIS — Z3480 Encounter for supervision of other normal pregnancy, unspecified trimester: Secondary | ICD-10-CM

## 2021-05-24 DIAGNOSIS — Z23 Encounter for immunization: Secondary | ICD-10-CM

## 2021-05-24 DIAGNOSIS — Z3A28 28 weeks gestation of pregnancy: Secondary | ICD-10-CM

## 2021-05-24 DIAGNOSIS — Z3482 Encounter for supervision of other normal pregnancy, second trimester: Secondary | ICD-10-CM

## 2021-05-24 NOTE — Progress Notes (Signed)
Subjective:  Katrina Cain is a 25 y.o. G1P0000 at 104w2d being seen today for ongoing prenatal care.  She is currently monitored for the following issues for this low-risk pregnancy and has Encounter for supervision of normal first pregnancy in first trimester and HSIL (high grade squamous intraepithelial lesion) on Pap smear of cervix on their problem list.  Patient reports backache.  Contractions: Not present. Vag. Bleeding: None.  Movement: Present. Denies leaking of fluid.   The following portions of the patient's history were reviewed and updated as appropriate: allergies, current medications, past family history, past medical history, past social history, past surgical history and problem list. Problem list updated.  Objective:   Vitals:   05/24/21 1551  BP: 125/72  Pulse: 93  Weight: 160 lb 4.8 oz (72.7 kg)    Fetal Status:     Movement: Present     General:  Alert, oriented and cooperative. Patient is in no acute distress.  Skin: Skin is warm and dry. No rash noted.   Cardiovascular: Normal heart rate noted  Respiratory: Normal respiratory effort, no problems with respiration noted  Abdomen: Soft, gravid, appropriate for gestational age. Pain/Pressure: Absent     Pelvic:  Cervical exam deferred        Extremities: Normal range of motion.  Edema: None  Mental Status: Normal mood and affect. Normal behavior. Normal judgment and thought content.   Urinalysis:      Assessment and Plan:  Pregnancy: G1P0000 at [redacted]w[redacted]d  1. Supervision of other normal pregnancy, antepartum   Preterm labor symptoms and general obstetric precautions including but not limited to vaginal bleeding, contractions, leaking of fluid and fetal movement were reviewed in detail with the patient. Please refer to After Visit Summary for other counseling recommendations.   Return in about 2 weeks (around 06/07/2021) for ROB.   Brock Bad, MD  05/24/21

## 2021-05-24 NOTE — Progress Notes (Signed)
Pt presents for ROB c/o intermittent SHOB.  Tdap given L Del w/o difficulty

## 2021-05-31 ENCOUNTER — Telehealth: Payer: Self-pay

## 2021-05-31 DIAGNOSIS — M549 Dorsalgia, unspecified: Secondary | ICD-10-CM

## 2021-05-31 DIAGNOSIS — O99891 Other specified diseases and conditions complicating pregnancy: Secondary | ICD-10-CM

## 2021-05-31 MED ORDER — CYCLOBENZAPRINE HCL 10 MG PO TABS
10.0000 mg | ORAL_TABLET | Freq: Three times a day (TID) | ORAL | 0 refills | Status: DC | PRN
Start: 2021-05-31 — End: 2021-07-08

## 2021-05-31 NOTE — Telephone Encounter (Signed)
Return call to 29wk ROB regarding triage vm c/o back pain pt states she was considering going to hospital due to pain +FM, no vaginal bleeding, no contractions, unsure of water intake, +discharge explained to pt increase in hormonal discharge is common in pregnancy pt unsure of discharge or leaking pt advised to put on a pad or panty liner and monitor if pad is soaked in 30 mins to 1 hr will need to go to MAU Discussed comfort measures on back pain per protocol Pt notes taking tylenol every 6 hrs PRN, has not started back stretches yet, gets breaks at work pt works on feet advised discomfort will not resolve until delivery  Pt states she will be getting belly band tomorrow I let pt know I will consult w/ provider on Rx for pain  Pt voiced understanding and agreeable with advice.

## 2021-06-01 ENCOUNTER — Encounter: Payer: Medicaid Other | Admitting: Obstetrics

## 2021-06-07 ENCOUNTER — Other Ambulatory Visit: Payer: Self-pay

## 2021-06-07 ENCOUNTER — Encounter: Payer: Self-pay | Admitting: Obstetrics

## 2021-06-07 ENCOUNTER — Ambulatory Visit (INDEPENDENT_AMBULATORY_CARE_PROVIDER_SITE_OTHER): Payer: Medicaid Other | Admitting: Obstetrics

## 2021-06-07 VITALS — BP 123/80 | HR 85 | Wt 161.9 lb

## 2021-06-07 DIAGNOSIS — Z3480 Encounter for supervision of other normal pregnancy, unspecified trimester: Secondary | ICD-10-CM

## 2021-06-07 DIAGNOSIS — D069 Carcinoma in situ of cervix, unspecified: Secondary | ICD-10-CM

## 2021-06-07 NOTE — Progress Notes (Signed)
Subjective:  Katrina Cain is a 25 y.o. G1P0000 at [redacted]w[redacted]d being seen today for ongoing prenatal care.  She is currently monitored for the following issues for this low-risk pregnancy and has Encounter for supervision of normal first pregnancy in first trimester and HSIL (high grade squamous intraepithelial lesion) on Pap smear of cervix on their problem list.  Patient reports backache.  Contractions: Not present. Vag. Bleeding: None.  Movement: Present. Denies leaking of fluid.   The following portions of the patient's history were reviewed and updated as appropriate: allergies, current medications, past family history, past medical history, past social history, past surgical history and problem list. Problem list updated.  Objective:   Vitals:   06/07/21 0936  Weight: 161 lb 14.4 oz (73.4 kg)    Fetal Status:     Movement: Present     General:  Alert, oriented and cooperative. Patient is in no acute distress.  Skin: Skin is warm and dry. No rash noted.   Cardiovascular: Normal heart rate noted  Respiratory: Normal respiratory effort, no problems with respiration noted  Abdomen: Soft, gravid, appropriate for gestational age. Pain/Pressure: Absent     Pelvic:  Cervical exam deferred        Extremities: Normal range of motion.  Edema: None  Mental Status: Normal mood and affect. Normal behavior. Normal judgment and thought content.   Urinalysis:      Assessment and Plan:  Pregnancy: G1P0000 at [redacted]w[redacted]d  1. Supervision of other normal pregnancy, antepartum  2. Severe dysplasia of cervix (CIN III) - needs LEEP 3-4 months postpartum   Preterm labor symptoms and general obstetric precautions including but not limited to vaginal bleeding, contractions, leaking of fluid and fetal movement were reviewed in detail with the patient. Please refer to After Visit Summary for other counseling recommendations.   Return in about 2 weeks (around 06/21/2021) for ROB.   Brock Bad, MD   06/07/21

## 2021-06-09 ENCOUNTER — Inpatient Hospital Stay (HOSPITAL_COMMUNITY)
Admission: AD | Admit: 2021-06-09 | Discharge: 2021-06-09 | Disposition: A | Discharge status: Home / Self Care | Payer: Medicaid Other | Attending: Obstetrics and Gynecology | Admitting: Obstetrics and Gynecology

## 2021-06-09 ENCOUNTER — Encounter (HOSPITAL_COMMUNITY): Payer: Self-pay | Admitting: Obstetrics and Gynecology

## 2021-06-09 ENCOUNTER — Other Ambulatory Visit: Payer: Self-pay

## 2021-06-09 DIAGNOSIS — Z3A30 30 weeks gestation of pregnancy: Secondary | ICD-10-CM

## 2021-06-09 DIAGNOSIS — R109 Unspecified abdominal pain: Secondary | ICD-10-CM

## 2021-06-09 DIAGNOSIS — O26893 Other specified pregnancy related conditions, third trimester: Secondary | ICD-10-CM | POA: Diagnosis not present

## 2021-06-09 DIAGNOSIS — O99891 Other specified diseases and conditions complicating pregnancy: Secondary | ICD-10-CM

## 2021-06-09 DIAGNOSIS — Z79899 Other long term (current) drug therapy: Secondary | ICD-10-CM | POA: Diagnosis not present

## 2021-06-09 DIAGNOSIS — M549 Dorsalgia, unspecified: Secondary | ICD-10-CM | POA: Diagnosis not present

## 2021-06-09 DIAGNOSIS — O26899 Other specified pregnancy related conditions, unspecified trimester: Secondary | ICD-10-CM

## 2021-06-09 HISTORY — DX: Unspecified abnormal cytological findings in specimens from vagina: R87.629

## 2021-06-09 LAB — URINALYSIS, ROUTINE W REFLEX MICROSCOPIC
Bilirubin Urine: NEGATIVE
Glucose, UA: NEGATIVE mg/dL
Hgb urine dipstick: NEGATIVE
Ketones, ur: NEGATIVE mg/dL
Nitrite: NEGATIVE
Protein, ur: NEGATIVE mg/dL
Specific Gravity, Urine: 1.023 (ref 1.005–1.030)
pH: 6 (ref 5.0–8.0)

## 2021-06-09 MED ORDER — ACETAMINOPHEN 500 MG PO TABS
1000.0000 mg | ORAL_TABLET | Freq: Once | ORAL | Status: AC
  Administered 2021-06-09: 1000 mg via ORAL
  Filled 2021-06-09: qty 2

## 2021-06-09 NOTE — MAU Note (Signed)
Yesterday was having some type of pressure sharp pain, happened 3 times in an hour.  Called her nurse, was told if it continued to get checked out.  She threw up, hasn't thrown up the entire preg.  Stomach was sore, thinks it was from the vomiting. This morning, the pain came back. Just thought she would come get checked out.

## 2021-06-09 NOTE — MAU Provider Note (Signed)
History     CSN: [id number]  Arrival date and time: 06/09/21 1134   Event Date/Time   First Provider Initiated Contact with Patient 06/09/21 1227      Chief Complaint  Patient presents with   Abdominal Pain   Back Pain   HPI  Ms.IRAIS MOTTRAM Is a 25 y.o.female G1P0000 @ 91w4dhere with abdominal pain and back pain. She reported  3 sharp pains in her lower abdomen yesterday and then vomited one time. She woke up this morning and continue to have sharp pain in her upper belly. Yesterday the pain was in her lower belly, today the pain is in her upper belly and radiates around to her back. The pain is felt on both sides.  She wanted to come in and make sure everything is ok. The pain worsens when the baby kicks.   + no bleeding or discharge  No fever + fetal movement.   OB History     Gravida  1   Para  0   Term  0   Preterm  0   AB  0   Living  0      SAB  0   IAB  0   Ectopic  0   Multiple  0   Live Births  0           Past Medical History:  Diagnosis Date   Asthma    Vaginal Pap smear, abnormal     Past Surgical History:  Procedure Laterality Date   NO PAST SURGERIES      Family History  Problem Relation Age of Onset   Diabetes Mother    Hypertension Mother    Cancer Father    Cancer Paternal Grandmother     Social History   Tobacco Use   Smoking status: Former    Types: Cigarettes   Smokeless tobacco: Never  Vaping Use   Vaping Use: Never used  Substance Use Topics   Alcohol use: No   Drug use: Not Currently    Types: Marijuana    Comment: Stopped when pregnancy confirmed    Allergies: No Known Allergies  Medications Prior to Admission  Medication Sig Dispense Refill Last Dose   cyclobenzaprine (FLEXERIL) 10 MG tablet Take 1 tablet (10 mg total) by mouth every 8 (eight) hours as needed for muscle spasms. 30 tablet 0 Past Week   Prenat-Fe Carbonyl-FA-Omega 3 (ONE-A-DAY WOMENS PRENATAL 1 PO) Take by mouth.   06/09/2021    Blood Pressure Monitoring (BLOOD PRESSURE KIT) DEVI 1 kit by Does not apply route once a week. (Patient not taking: Reported on 05/24/2021) 1 each 0    Doxylamine-Pyridoxine (DICLEGIS) 10-10 MG TBEC Take 2 tablets by mouth at bedtime. If symptoms persist, add one tablet in the morning and one in the afternoon (Patient not taking: No sig reported) 100 tablet 5 More than a month   metoCLOPramide (REGLAN) 10 MG tablet Take 1 tablet (10 mg total) by mouth 3 (three) times daily with meals as needed for nausea. 30 tablet 2 More than a month   terconazole (TERAZOL 7) 0.4 % vaginal cream Place 1 applicator vaginally at bedtime. 45 g 0    Results for orders placed or performed during the hospital encounter of 06/09/21 (from the past 48 hour(s))  Urinalysis, Routine w reflex microscopic Urine, Clean Catch     Status: Abnormal   Collection Time: 06/09/21 12:21 PM  Result Value Ref Range   Color, Urine AMBER (A)  YELLOW    Comment: BIOCHEMICALS MAY BE AFFECTED BY COLOR   APPearance CLOUDY (A) CLEAR   Specific Gravity, Urine 1.023 1.005 - 1.030   pH 6.0 5.0 - 8.0   Glucose, UA NEGATIVE NEGATIVE mg/dL   Hgb urine dipstick NEGATIVE NEGATIVE   Bilirubin Urine NEGATIVE NEGATIVE   Ketones, ur NEGATIVE NEGATIVE mg/dL   Protein, ur NEGATIVE NEGATIVE mg/dL   Nitrite NEGATIVE NEGATIVE   Leukocytes,Ua SMALL (A) NEGATIVE   RBC / HPF 6-10 0 - 5 RBC/hpf   WBC, UA 11-20 0 - 5 WBC/hpf   Bacteria, UA RARE (A) NONE SEEN   Squamous Epithelial / LPF 11-20 0 - 5   Mucus PRESENT    Ca Oxalate Crys, UA PRESENT     Comment: Performed at [facility], [address], [place], [place] [address]    Review of Systems  Constitutional:  Negative for fever.  Gastrointestinal:  Positive for abdominal pain.  Genitourinary:  Negative for vaginal bleeding and vaginal discharge.  Physical Exam   Blood pressure 132/80, pulse 100, temperature 98.3 F (36.8 C), temperature source Oral, resp. rate 18, height _0  (1.727 m),  weight 74.8 kg, last menstrual period 11/07/2020, SpO2 99 %.  Physical Exam Vitals and nursing note reviewed.  Constitutional:      General: She is not in acute distress.    Appearance: She is well-developed. She is not ill-appearing, toxic-appearing or diaphoretic.  HENT:     Head: Normocephalic.  Abdominal:     Tenderness: There is no abdominal tenderness. There is no guarding or rebound.  Genitourinary:    Comments: Cervix closed, thick, posterior Exam by Noni Saupe, NP  Skin:    General: Skin is warm.  Neurological:     Mental Status: She is alert and oriented to person, place, and time.   Fetal Tracing: Baseline: 150 bpm Variability: Moderate  Accelerations: 15x15 Decelerations: None Toco: None  MAU Course  Procedures  MDM  Tylenol given 1 gram.  Patient feels reassured   Assessment and Plan   A:  1. Abdominal pain affecting pregnancy   2. Back pain in pregnancy   3. [redacted] weeks gestation of pregnancy      P:  Discharge home in stable condition Return to MAU if symptoms worsen Patient requested work note Ok to use tylenol as directed on the bottle.  Lezlie Lye, NP 06/09/2021 2:51 PM

## 2021-06-10 LAB — CULTURE, OB URINE: Culture: >=100000 — AB

## 2021-06-21 ENCOUNTER — Other Ambulatory Visit: Payer: Self-pay

## 2021-06-21 ENCOUNTER — Ambulatory Visit (INDEPENDENT_AMBULATORY_CARE_PROVIDER_SITE_OTHER): Payer: Medicaid Other

## 2021-06-21 VITALS — BP 136/88 | HR 103 | Wt 165.0 lb

## 2021-06-21 DIAGNOSIS — Z3A32 32 weeks gestation of pregnancy: Secondary | ICD-10-CM

## 2021-06-21 DIAGNOSIS — Z3401 Encounter for supervision of normal first pregnancy, first trimester: Secondary | ICD-10-CM

## 2021-06-21 NOTE — Progress Notes (Signed)
   PRENATAL VISIT NOTE  Subjective:  Katrina Cain is a 25 y.o. G1P0000 at [redacted]w[redacted]d being seen today for ongoing prenatal care.  She is currently monitored for the following issues for this low-risk pregnancy and has Encounter for supervision of normal first pregnancy in first trimester and HSIL (high grade squamous intraepithelial lesion) on Pap smear of cervix on their problem list.  Patient reports no complaints.  Contractions: Not present. Vag. Bleeding: None.  Movement: Present. Denies leaking of fluid.   The following portions of the patient's history were reviewed and updated as appropriate: allergies, current medications, past family history, past medical history, past social history, past surgical history and problem list.   Objective:   Vitals:   06/21/21 0940  BP: 136/88  Pulse: (!) 103  Weight: 165 lb (74.8 kg)    Fetal Status: Fetal Heart Rate (bpm): 156 Fundal Height: 32 cm Movement: Present     General:  Alert, oriented and cooperative. Patient is in no acute distress.  Skin: Skin is warm and dry. No rash noted.   Cardiovascular: Normal heart rate noted  Respiratory: Normal respiratory effort, no problems with respiration noted  Abdomen: Soft, gravid, appropriate for gestational age.  Pain/Pressure: Present     Pelvic: Cervical exam deferred        Extremities: Normal range of motion.  Edema: Trace  Mental Status: Normal mood and affect. Normal behavior. Normal judgment and thought content.   Assessment and Plan:  Pregnancy: G1P0000 at [redacted]w[redacted]d 1. Encounter for supervision of normal first pregnancy in first trimester - No complaints, routine care - Anticipatory guidance of next visits reviewed - Labor pain management options reviewed at length and encouraged patient to begin considering options for when she goes into labor  2. [redacted] weeks gestation of pregnancy   Preterm labor symptoms and general obstetric precautions including but not limited to vaginal bleeding,  contractions, leaking of fluid and fetal movement were reviewed in detail with the patient. Please refer to After Visit Summary for other counseling recommendations.   Return in about 2 weeks (around 07/05/2021) for Return OB visit.   Rolm Bookbinder, CNM 06/21/21 10:01 AM

## 2021-06-21 NOTE — Progress Notes (Signed)
ROB [redacted]w[redacted]d PHQ9=0 GAD7=0  CH:EKBT   Denies any HA;s or visual changes this week.

## 2021-06-21 NOTE — Patient Instructions (Signed)

## 2021-06-27 ENCOUNTER — Inpatient Hospital Stay (HOSPITAL_COMMUNITY)
Admission: AD | Admit: 2021-06-27 | Discharge: 2021-06-28 | Disposition: A | Discharge status: Home / Self Care | Payer: Medicaid Other | Attending: Obstetrics & Gynecology | Admitting: Obstetrics & Gynecology

## 2021-06-27 ENCOUNTER — Encounter (HOSPITAL_COMMUNITY): Payer: Self-pay | Admitting: Obstetrics & Gynecology

## 2021-06-27 ENCOUNTER — Other Ambulatory Visit: Payer: Self-pay

## 2021-06-27 DIAGNOSIS — Z3A33 33 weeks gestation of pregnancy: Secondary | ICD-10-CM | POA: Diagnosis not present

## 2021-06-27 DIAGNOSIS — O99613 Diseases of the digestive system complicating pregnancy, third trimester: Secondary | ICD-10-CM

## 2021-06-27 DIAGNOSIS — R1313 Dysphagia, pharyngeal phase: Secondary | ICD-10-CM

## 2021-06-27 DIAGNOSIS — K219 Gastro-esophageal reflux disease without esophagitis: Secondary | ICD-10-CM

## 2021-06-27 DIAGNOSIS — Z8249 Family history of ischemic heart disease and other diseases of the circulatory system: Secondary | ICD-10-CM | POA: Diagnosis not present

## 2021-06-27 DIAGNOSIS — O4103X0 Oligohydramnios, third trimester, not applicable or unspecified: Secondary | ICD-10-CM | POA: Insufficient documentation

## 2021-06-27 DIAGNOSIS — O1493 Unspecified pre-eclampsia, third trimester: Secondary | ICD-10-CM

## 2021-06-27 DIAGNOSIS — O99891 Other specified diseases and conditions complicating pregnancy: Secondary | ICD-10-CM | POA: Diagnosis not present

## 2021-06-27 DIAGNOSIS — R1319 Other dysphagia: Secondary | ICD-10-CM | POA: Diagnosis not present

## 2021-06-27 DIAGNOSIS — O26893 Other specified pregnancy related conditions, third trimester: Secondary | ICD-10-CM | POA: Diagnosis not present

## 2021-06-27 DIAGNOSIS — Z87891 Personal history of nicotine dependence: Secondary | ICD-10-CM | POA: Insufficient documentation

## 2021-06-27 DIAGNOSIS — O4703 False labor before 37 completed weeks of gestation, third trimester: Secondary | ICD-10-CM | POA: Diagnosis not present

## 2021-06-27 DIAGNOSIS — O4700 False labor before 37 completed weeks of gestation, unspecified trimester: Secondary | ICD-10-CM | POA: Diagnosis present

## 2021-06-27 DIAGNOSIS — R1310 Dysphagia, unspecified: Secondary | ICD-10-CM | POA: Insufficient documentation

## 2021-06-27 DIAGNOSIS — O1490 Unspecified pre-eclampsia, unspecified trimester: Secondary | ICD-10-CM | POA: Diagnosis present

## 2021-06-27 LAB — COMPREHENSIVE METABOLIC PANEL
ALT: 23 U/L (ref 0–44)
AST: 25 U/L (ref 15–41)
Albumin: 2.8 g/dL — ABNORMAL LOW (ref 3.5–5.0)
Alkaline Phosphatase: 183 U/L — ABNORMAL HIGH (ref 38–126)
Anion gap: 8 (ref 5–15)
BUN: 10 mg/dL (ref 6–20)
CO2: 21 mmol/L — ABNORMAL LOW (ref 22–32)
Calcium: 9.2 mg/dL (ref 8.9–10.3)
Chloride: 106 mmol/L (ref 98–111)
Creatinine, Ser: 0.69 mg/dL (ref 0.44–1.00)
GFR, Estimated: >60 mL/min (ref >60)
Glucose, Bld: 80 mg/dL (ref 70–99)
Potassium: 3.7 mmol/L (ref 3.5–5.1)
Sodium: 135 mmol/L (ref 135–145)
Total Bilirubin: 0.5 mg/dL (ref 0.3–1.2)
Total Protein: 6 g/dL — ABNORMAL LOW (ref 6.5–8.1)

## 2021-06-27 LAB — CBC
HCT: 30.8 % — ABNORMAL LOW (ref 36.0–46.0)
Hemoglobin: 10.7 g/dL — ABNORMAL LOW (ref 12.0–15.0)
MCH: 30.6 pg (ref 26.0–34.0)
MCHC: 34.7 g/dL (ref 30.0–36.0)
MCV: 88 fL (ref 80.0–100.0)
Platelets: 211 10*3/uL (ref 150–400)
RBC: 3.5 MIL/uL — ABNORMAL LOW (ref 3.87–5.11)
RDW: 13 % (ref 11.5–15.5)
WBC: 7.6 10*3/uL (ref 4.0–10.5)
nRBC: 0 % (ref 0.0–0.2)

## 2021-06-27 LAB — PROTEIN / CREATININE RATIO, URINE
Creatinine, Urine: 424.64 mg/dL
Protein Creatinine Ratio: 0.48 mg/mg{Cre} — ABNORMAL HIGH (ref 0.00–0.15)
Total Protein, Urine: 202 mg/dL

## 2021-06-27 LAB — FETAL FIBRONECTIN: Fetal Fibronectin: NEGATIVE

## 2021-06-27 MED ORDER — NIFEDIPINE 10 MG PO CAPS
10.0000 mg | ORAL_CAPSULE | ORAL | Status: DC | PRN
  Administered 2021-06-27: 10 mg via ORAL
  Filled 2021-06-27: qty 1

## 2021-06-27 MED ORDER — FAMOTIDINE 20 MG PO TABS
20.0000 mg | ORAL_TABLET | Freq: Once | ORAL | Status: AC
  Administered 2021-06-27: 20 mg via ORAL
  Filled 2021-06-27: qty 1

## 2021-06-27 NOTE — MAU Note (Addendum)
..  Katrina Cain is a 25 y.o. at [redacted]w[redacted]d here in MAU reporting: Over the past few days she has had difficulty swallowing, spit or water, the difficulty increases at night. She has also had an increase in heart burn and will vomit sometimes when she tries to swallow. Denies sore throat or any other pain. +FM. Denies vaginal bleeding or leaking of fluid.  Denies headache, visual changes, or epigastric pain.  Pain score: 0/10 Vitals:   06/27/21 1943 06/27/21 1944  BP:  (!) 142/97  Pulse:  87  Resp:  17  Temp:  98.3 F (36.8 C)  SpO2: 100%      FHT:152

## 2021-06-27 NOTE — MAU Provider Note (Signed)
Chief Complaint:  Dysphagia   Event Date/Time   First Provider Initiated Contact with Patient 06/27/21 2013     HPI: Katrina Cain is a 25 y.o. G1P0000 at 37w1dwho presents to maternity admissions reporting trouble swallowing.for the last few days.  Has been having a lot of acid reflux, relieved a little by Tums but persistent.  No sore throat or cough.  She reports good fetal movement, denies LOF, vaginal bleeding, vaginal itching/burning, urinary symptoms, h/a, dizziness, n/v, diarrhea, constipation or fever/chills.  She denies headache, visual changes or RUQ abdominal pain.  Had a headache on Sunday but none today. Some feet swelling in the past 2 wks.   Hypertension This is a new problem. The current episode started today. The problem is unchanged. Associated symptoms include peripheral edema. Pertinent negatives include no blurred vision, chest pain, headaches or shortness of breath. There are no associated agents to hypertension. There are no known risk factors for coronary artery disease. Past treatments include nothing. There are no compliance problems.    RN Note: .Katrina Cain is a 25 y.o. at [redacted]w[redacted]d here in MAU reporting: Over the past few days she has had difficulty swallowing, spit or water, the difficulty increases at night. She has also had an increase in heart burn and will vomit sometimes when she tries to swallow. Denies sore throat or any other pain. +FM. Denies vaginal bleeding or leaking of fluid.  Denies headache, visual changes, or epigastric pain.  Pain score: 0/10  Past Medical History: Past Medical History:  Diagnosis Date   Asthma    Vaginal Pap smear, abnormal     Past obstetric history: OB History  Gravida Para Term Preterm AB Living  1 0 0 0 0 0  SAB IAB Ectopic Multiple Live Births  0 0 0 0 0    # Outcome Date GA Lbr Len/2nd Weight Sex Delivery Anes PTL Lv  1 Current             Past Surgical History: Past Surgical History:  Procedure  Laterality Date   NO PAST SURGERIES      Family History: Family History  Problem Relation Age of Onset   Diabetes Mother    Hypertension Mother    Cancer Father    Cancer Paternal Grandmother     Social History: Social History   Tobacco Use   Smoking status: Former    Types: Cigarettes   Smokeless tobacco: Never  Building services engineer Use: Never used  Substance Use Topics   Alcohol use: No   Drug use: Not Currently    Types: Marijuana    Comment: Stopped when pregnancy confirmed    Allergies: No Known Allergies  Meds:  Medications Prior to Admission  Medication Sig Dispense Refill Last Dose   cyclobenzaprine (FLEXERIL) 10 MG tablet Take 1 tablet (10 mg total) by mouth every 8 (eight) hours as needed for muscle spasms. 30 tablet 0 Past Month   Doxylamine-Pyridoxine 10-10 MG TBEC Take by mouth.   Past Week   Prenat-Fe Carbonyl-FA-Omega 3 (ONE-A-DAY WOMENS PRENATAL 1 PO) Take by mouth.   06/27/2021   metoCLOPramide (REGLAN) 10 MG tablet Take 1 tablet (10 mg total) by mouth 3 (three) times daily with meals as needed for nausea. (Patient not taking: Reported on 06/21/2021) 30 tablet 2    terconazole (TERAZOL 7) 0.4 % vaginal cream Place 1 applicator vaginally at bedtime. (Patient not taking: Reported on 06/21/2021) 45 g 0     I  have reviewed patient's Past Medical Hx, Surgical Hx, Family Hx, Social Hx, medications and allergies.   ROS:  Review of Systems  Eyes:  Negative for blurred vision.  Respiratory:  Negative for shortness of breath.   Cardiovascular:  Negative for chest pain.  Neurological:  Negative for headaches.  Other systems negative  Physical Exam  Patient Vitals for the past 24 hrs:  BP Temp Temp src Pulse Resp SpO2 Height Weight  06/27/21 2009 (!) 146/96 -- -- 77 18 100 % -- --  06/27/21 1944 (!) 142/97 98.3 F (36.8 C) Oral 87 17 -- 5\' 8"  (1.727 m) 76.4 kg  06/27/21 1943 -- -- -- -- -- 100 % -- --   Vitals:   06/27/21 2230 06/27/21 2245 06/27/21 2300  06/28/21 0046  BP: (!) 145/88 (!) 139/91 (!) 142/91 (!) 143/89  Pulse: 84 86 81 89  Resp:    18  Temp:      TempSrc:    Oral  SpO2: 100% 100%  99%  Weight:      Height:        Constitutional: Well-developed, well-nourished female in no acute distress.  Cardiovascular: normal rate and rhythm Respiratory: normal effort, clear to auscultation bilaterally GI: Abd soft, non-tender, gravid appropriate for gestational age.   No rebound or guarding. MS: Extremities nontender, Trace to 1+ pedal edema, normal ROM Neurologic: Alert and oriented x 4. DTRs 2+ with no clonus GU: Neg CVAT.  FHT:  Baseline 145 , moderate variability, accelerations present, no decelerations Contractions: q 4 mins Irregular    Labs: Results for orders placed or performed during the hospital encounter of 06/27/21 (from the past 24 hour(s))  Protein / creatinine ratio, urine     Status: Abnormal   Collection Time: 06/27/21  8:12 PM  Result Value Ref Range   Creatinine, Urine 424.64 mg/dL   Total Protein, Urine 202 mg/dL   Protein Creatinine Ratio 0.48 (H) 0.00 - 0.15 mg/mg[Cre]  CBC     Status: Abnormal   Collection Time: 06/27/21  8:19 PM  Result Value Ref Range   WBC 7.6 4.0 - 10.5 K/uL   RBC 3.50 (L) 3.87 - 5.11 MIL/uL   Hemoglobin 10.7 (L) 12.0 - 15.0 g/dL   HCT 06/29/21 (L) 24.2 - 68.3 %   MCV 88.0 80.0 - 100.0 fL   MCH 30.6 26.0 - 34.0 pg   MCHC 34.7 30.0 - 36.0 g/dL   RDW 41.9 62.2 - 29.7 %   Platelets 211 150 - 400 K/uL   nRBC 0.0 0.0 - 0.2 %  Comprehensive metabolic panel     Status: Abnormal   Collection Time: 06/27/21  8:19 PM  Result Value Ref Range   Sodium 135 135 - 145 mmol/L   Potassium 3.7 3.5 - 5.1 mmol/L   Chloride 106 98 - 111 mmol/L   CO2 21 (L) 22 - 32 mmol/L   Glucose, Bld 80 70 - 99 mg/dL   BUN 10 6 - 20 mg/dL   Creatinine, Ser 06/29/21 0.44 - 1.00 mg/dL   Calcium 9.2 8.9 - 2.11 mg/dL   Total Protein 6.0 (L) 6.5 - 8.1 g/dL   Albumin 2.8 (L) 3.5 - 5.0 g/dL   AST 25 15 - 41 U/L    ALT 23 0 - 44 U/L   Alkaline Phosphatase 183 (H) 38 - 126 U/L   Total Bilirubin 0.5 0.3 - 1.2 mg/dL   GFR, Estimated 94.1 >74 mL/min   Anion gap 8 5 - 15  Fetal fibronectin     Status: None   Collection Time: 06/27/21  9:10 PM  Result Value Ref Range   Fetal Fibronectin NEGATIVE NEGATIVE    AB/Positive/-- (04/11 1434)  Imaging:  BPP 8/8, 10/10 Placenta posterior AFI 8cm (4%ile)  MAU Course/MDM: I have ordered labs and reviewed results.  NST reviewed Consult Dr Macon Large with presentation, exam findings and test results.  Treatments in MAU included Pepcid given for acid reflux                                                Labs done to assess for preeclampsia.  The PCR was elevated but others were normal   BPs remained elevated throughout. Discussed preeclampsia with her and will start her on twice weekly testing.  BPP done tonight to begin with 8/8   Discussed delivery at 37 weeks unless she develops severe features                                                Contractions noted.  Not painful to patient.  FFn was negative Procardia given which did slow contractions. Cervix is long and closed.    Assessment: Single IUP at [redacted]w[redacted]d Dysphagia related to acid reflux New Preeclampsia Borderline low amniotic fluid Preterm contractions, negative fetal fibronectin  Plan: Discharge home Rx Protonix for GERD Discussed signs of severe preeclampsia to return for Message sent to office for appt on Friday and start testing IOL at 37wks unless becomes severe. Labor precautions and fetal kick counts Follow up in Office for prenatal visits  Encouraged to return if she develops worsening of symptoms, increase in pain, fever, or other concerning symptoms.   Pt stable at time of discharge.  Wynelle Bourgeois CNM, MSN Certified Nurse-Midwife 06/27/2021 8:19 PM

## 2021-06-28 ENCOUNTER — Inpatient Hospital Stay (HOSPITAL_BASED_OUTPATIENT_CLINIC_OR_DEPARTMENT_OTHER): Payer: Medicaid Other

## 2021-06-28 DIAGNOSIS — K219 Gastro-esophageal reflux disease without esophagitis: Secondary | ICD-10-CM | POA: Diagnosis present

## 2021-06-28 DIAGNOSIS — O1490 Unspecified pre-eclampsia, unspecified trimester: Secondary | ICD-10-CM | POA: Diagnosis present

## 2021-06-28 DIAGNOSIS — O4700 False labor before 37 completed weeks of gestation, unspecified trimester: Secondary | ICD-10-CM | POA: Diagnosis present

## 2021-06-28 DIAGNOSIS — O1493 Unspecified pre-eclampsia, third trimester: Secondary | ICD-10-CM

## 2021-06-28 DIAGNOSIS — Z3A32 32 weeks gestation of pregnancy: Secondary | ICD-10-CM

## 2021-06-28 MED ORDER — PANTOPRAZOLE SODIUM 20 MG PO TBEC: 20.0000 mg | DELAYED_RELEASE_TABLET | Freq: Every day | ORAL | 1 refills | Status: DC

## 2021-06-29 ENCOUNTER — Encounter (HOSPITAL_COMMUNITY): Payer: Self-pay | Admitting: Family Medicine

## 2021-06-29 ENCOUNTER — Other Ambulatory Visit: Payer: Self-pay

## 2021-06-29 ENCOUNTER — Inpatient Hospital Stay (HOSPITAL_COMMUNITY)
Admission: AD | Admit: 2021-06-29 | Discharge: 2021-06-29 | Disposition: A | Discharge status: Home / Self Care | Payer: Medicaid Other | Attending: Family Medicine | Admitting: Family Medicine

## 2021-06-29 DIAGNOSIS — O26893 Other specified pregnancy related conditions, third trimester: Secondary | ICD-10-CM | POA: Insufficient documentation

## 2021-06-29 DIAGNOSIS — Z3A33 33 weeks gestation of pregnancy: Secondary | ICD-10-CM | POA: Insufficient documentation

## 2021-06-29 DIAGNOSIS — O99891 Other specified diseases and conditions complicating pregnancy: Secondary | ICD-10-CM

## 2021-06-29 DIAGNOSIS — R1310 Dysphagia, unspecified: Secondary | ICD-10-CM | POA: Insufficient documentation

## 2021-06-29 DIAGNOSIS — O1493 Unspecified pre-eclampsia, third trimester: Secondary | ICD-10-CM | POA: Diagnosis not present

## 2021-06-29 DIAGNOSIS — Z87891 Personal history of nicotine dependence: Secondary | ICD-10-CM | POA: Insufficient documentation

## 2021-06-29 LAB — URINALYSIS, MICROSCOPIC (REFLEX)

## 2021-06-29 LAB — URINALYSIS, ROUTINE W REFLEX MICROSCOPIC
Glucose, UA: NEGATIVE mg/dL
Ketones, ur: >80 mg/dL — AB
Nitrite: NEGATIVE
Protein, ur: >300 mg/dL — AB
Specific Gravity, Urine: >1.03 — ABNORMAL HIGH (ref 1.005–1.030)
pH: 6 (ref 5.0–8.0)

## 2021-06-29 MED ORDER — FAMOTIDINE IN NACL 20-0.9 MG/50ML-% IV SOLN
20.0000 mg | Freq: Once | INTRAVENOUS | Status: AC
  Administered 2021-06-29: 20 mg via INTRAVENOUS
  Filled 2021-06-29: qty 50

## 2021-06-29 MED ORDER — LACTATED RINGERS IV BOLUS
1000.0000 mL | Freq: Once | INTRAVENOUS | Status: AC
  Administered 2021-06-29: 1000 mL via INTRAVENOUS

## 2021-06-29 MED ORDER — FAMOTIDINE 20 MG PO TABS: 20.0000 mg | ORAL_TABLET | Freq: Two times a day (BID) | ORAL | 0 refills | Status: DC

## 2021-06-29 MED ORDER — METOCLOPRAMIDE HCL 5 MG/ML IJ SOLN
10.0000 mg | Freq: Once | INTRAMUSCULAR | Status: AC
  Administered 2021-06-29: 10 mg via INTRAVENOUS
  Filled 2021-06-29: qty 2

## 2021-06-29 MED ORDER — GLYCOPYRROLATE 1 MG PO TABS: 1.0000 mg | ORAL_TABLET | Freq: Three times a day (TID) | ORAL | 0 refills | Status: DC

## 2021-06-29 MED ORDER — GLYCOPYRROLATE 0.2 MG/ML IJ SOLN
0.2000 mg | Freq: Once | INTRAMUSCULAR | Status: AC
  Administered 2021-06-29: 0.2 mg via INTRAVENOUS
  Filled 2021-06-29: qty 1

## 2021-06-29 NOTE — MAU Note (Signed)
Presents stating she was seen Wednesday and given meds for heartburn.  States since she began taking the med yesterday she's been vomiting.  Reports the med is causing her to increased salivation and vomiting.   Denies VB or LOF.  Endorses +FM.

## 2021-06-29 NOTE — MAU Provider Note (Signed)
History     CSN: [id number]  Arrival date and time: 06/29/21 0756   Event Date/Time   First Provider Initiated Contact with Patient 06/29/21 [phone]      Chief Complaint  Patient presents with   Emesis   HPI Katrina Cain is a 25 y.o. G1P0000 at [redacted]w[redacted]d who presents with difficulty swallowing. Was seen for same on Wednesday. Symptoms started on Monday (unrelated to eating). States she is able to swallow but feels like it is difficult to swallow both foods and liquids. This occurs every time she eats or drinks. Has also had associated heartburn. Was prescribed protonix the other day & took her first dose yesterday. Since taking protonix she reports increase in salivation & n/v. Tried to eat an omelet this morning but vomited it undigested. States increased saliva makes her feel like she's choking on spit when she's trying to sleep. Denies cough, SOB, chest pain. Has not had these symptoms before.  Was diagnosed with preeclampsia on Wednesday. Denies headache, visual disturbance, or epigastric pain.  Denies contractions, vaginal bleeding, or LOF. Good fetal movement.   OB History     Gravida  1   Para  0   Term  0   Preterm  0   AB  0   Living  0      SAB  0   IAB  0   Ectopic  0   Multiple  0   Live Births  0           Past Medical History:  Diagnosis Date   Asthma    Vaginal Pap smear, abnormal     Past Surgical History:  Procedure Laterality Date   NO PAST SURGERIES      Family History  Problem Relation Age of Onset   Diabetes Mother    Hypertension Mother    Cancer Father    Cancer Paternal Grandmother     Social History   Tobacco Use   Smoking status: Former    Types: Cigarettes   Smokeless tobacco: Never  Vaping Use   Vaping Use: Never used  Substance Use Topics   Alcohol use: No   Drug use: Not Currently    Types: Marijuana    Comment: Stopped when pregnancy confirmed    Allergies: No Known Allergies  No medications prior to  admission.    Review of Systems  Constitutional: Negative.   HENT:  Positive for trouble swallowing. Negative for sore throat.   Respiratory:  Negative for cough and shortness of breath.   Cardiovascular:  Negative for chest pain.  Gastrointestinal:  Positive for nausea and vomiting. Negative for abdominal distention, abdominal pain, constipation and diarrhea.  Genitourinary: Negative.   Physical Exam   Blood pressure 136/87, pulse 80, temperature 98.7 F (37.1 C), temperature source Oral, resp. rate 20, height 5\' 8"  (1.727 m), weight 75.8 kg, last menstrual period 11/07/2020, SpO2 100 %.  Physical Exam Vitals and nursing note reviewed.  Constitutional:      General: She is not in acute distress.    Appearance: Normal appearance. She is not ill-appearing.  HENT:     Head: Normocephalic and atraumatic.  Eyes:     General: No scleral icterus. Neck:     Thyroid: No thyroid mass or thyromegaly.     Trachea: Trachea normal. No tracheal tenderness.  Cardiovascular:     Rate and Rhythm: Normal rate and regular rhythm.     Heart sounds: Normal heart sounds.  Pulmonary:  Effort: Pulmonary effort is normal. No respiratory distress.     Breath sounds: Normal breath sounds. No wheezing.  Musculoskeletal:     Cervical back: Neck supple.  Skin:    General: Skin is warm and dry.  Neurological:     Mental Status: She is alert.  Psychiatric:        Mood and Affect: Mood normal.        Behavior: Behavior normal.   NST:  Baseline: 145 bpm, Variability: Good {> 6 bpm), Accelerations: Reactive, and Decelerations: Absent  MAU Course  Procedures Results for orders placed or performed during the hospital encounter of 06/29/21 (from the past 24 hour(s))  Urinalysis, Routine w reflex microscopic Urine, Clean Catch     Status: Abnormal   Collection Time: 06/29/21  8:14 AM  Result Value Ref Range   Color, Urine YELLOW YELLOW   APPearance CLOUDY (A) CLEAR   Specific Gravity, Urine >1.030  (H) 1.005 - 1.030   pH 6.0 5.0 - 8.0   Glucose, UA NEGATIVE NEGATIVE mg/dL   Hgb urine dipstick SMALL (A) NEGATIVE   Bilirubin Urine SMALL (A) NEGATIVE   Ketones, ur >80 (A) NEGATIVE mg/dL   Protein, ur >353 (A) NEGATIVE mg/dL   Nitrite NEGATIVE NEGATIVE   Leukocytes,Ua TRACE (A) NEGATIVE  Urinalysis, Microscopic (reflex)     Status: Abnormal   Collection Time: 06/29/21  8:14 AM  Result Value Ref Range   RBC / HPF 0-5 0 - 5 RBC/hpf   WBC, UA 11-20 0 - 5 WBC/hpf   Bacteria, UA MANY (A) NONE SEEN   Squamous Epithelial / LPF 11-20 0 - 5    MDM Second visit for dysphagia this week. Unable to tolerate GERD treatment. Given IV fluids & meds in MAU due to dehydration. Symptoms improved after pepcid, reglan, & robinul. Patient feels that protonix worsened symptoms & would like to try different treatment. Will d/c protonix & start on pepcid BID. Patient will restart reglan. Rx robinul for current symptoms - encouraged her to discontinue use closer to delivery. If symptoms don't improve - may need further evaluation of dysphagia on outpatient basis. Patient is agreeable with this plan.   Preeclampsia diagnosed earlier this week. No severe range BPs today & she is asymptomatic. Repeat labs not indicated.  Assessment and Plan   1. Dysphagia, unspecified type  -d/c protonix -rx pepcid & robinul -restart reglan  2. Pre-eclampsia in third trimester  -reviewed preeclampsia emergencies & reasons to return to MAU  3. [redacted] weeks gestation of pregnancy      Judeth Horn 06/29/2021, 12:48 PM

## 2021-07-02 ENCOUNTER — Ambulatory Visit (INDEPENDENT_AMBULATORY_CARE_PROVIDER_SITE_OTHER): Payer: Medicaid Other | Admitting: Obstetrics and Gynecology

## 2021-07-02 ENCOUNTER — Observation Stay (HOSPITAL_BASED_OUTPATIENT_CLINIC_OR_DEPARTMENT_OTHER): Payer: Medicaid Other

## 2021-07-02 ENCOUNTER — Encounter (HOSPITAL_COMMUNITY): Payer: Self-pay | Admitting: Obstetrics & Gynecology

## 2021-07-02 ENCOUNTER — Encounter (HOSPITAL_COMMUNITY): Payer: Self-pay | Admitting: Obstetrics and Gynecology

## 2021-07-02 ENCOUNTER — Inpatient Hospital Stay (HOSPITAL_COMMUNITY)
Admission: AD | Admit: 2021-07-02 | Discharge: 2021-07-08 | DRG: 788 | Disposition: A | Discharge status: Home / Self Care | Payer: Medicaid Other | Attending: Obstetrics and Gynecology | Admitting: Obstetrics and Gynecology

## 2021-07-02 ENCOUNTER — Encounter: Payer: Self-pay | Admitting: Obstetrics and Gynecology

## 2021-07-02 ENCOUNTER — Other Ambulatory Visit: Payer: Self-pay

## 2021-07-02 ENCOUNTER — Inpatient Hospital Stay (HOSPITAL_COMMUNITY)
Admission: AD | Admit: 2021-07-02 | Discharge: 2021-07-02 | Disposition: A | Discharge status: Home / Self Care | Payer: Medicaid Other | Attending: Obstetrics & Gynecology | Admitting: Obstetrics & Gynecology

## 2021-07-02 VITALS — BP 145/95 | HR 92 | Wt 165.9 lb

## 2021-07-02 DIAGNOSIS — E860 Dehydration: Secondary | ICD-10-CM | POA: Diagnosis not present

## 2021-07-02 DIAGNOSIS — O99283 Endocrine, nutritional and metabolic diseases complicating pregnancy, third trimester: Secondary | ICD-10-CM | POA: Diagnosis not present

## 2021-07-02 DIAGNOSIS — Z87891 Personal history of nicotine dependence: Secondary | ICD-10-CM | POA: Insufficient documentation

## 2021-07-02 DIAGNOSIS — Z8249 Family history of ischemic heart disease and other diseases of the circulatory system: Secondary | ICD-10-CM | POA: Diagnosis not present

## 2021-07-02 DIAGNOSIS — O4700 False labor before 37 completed weeks of gestation, unspecified trimester: Secondary | ICD-10-CM

## 2021-07-02 DIAGNOSIS — Z3A33 33 weeks gestation of pregnancy: Secondary | ICD-10-CM

## 2021-07-02 DIAGNOSIS — Z79899 Other long term (current) drug therapy: Secondary | ICD-10-CM | POA: Insufficient documentation

## 2021-07-02 DIAGNOSIS — O1414 Severe pre-eclampsia complicating childbirth: Principal | ICD-10-CM | POA: Diagnosis present

## 2021-07-02 DIAGNOSIS — Z3689 Encounter for other specified antenatal screening: Secondary | ICD-10-CM | POA: Insufficient documentation

## 2021-07-02 DIAGNOSIS — O288 Other abnormal findings on antenatal screening of mother: Secondary | ICD-10-CM | POA: Diagnosis not present

## 2021-07-02 DIAGNOSIS — R87613 High grade squamous intraepithelial lesion on cytologic smear of cervix (HGSIL): Secondary | ICD-10-CM | POA: Diagnosis present

## 2021-07-02 DIAGNOSIS — Z20822 Contact with and (suspected) exposure to covid-19: Secondary | ICD-10-CM | POA: Diagnosis present

## 2021-07-02 DIAGNOSIS — O1493 Unspecified pre-eclampsia, third trimester: Secondary | ICD-10-CM

## 2021-07-02 DIAGNOSIS — O9962 Diseases of the digestive system complicating childbirth: Secondary | ICD-10-CM | POA: Diagnosis present

## 2021-07-02 DIAGNOSIS — O0993 Supervision of high risk pregnancy, unspecified, third trimester: Secondary | ICD-10-CM

## 2021-07-02 DIAGNOSIS — K219 Gastro-esophageal reflux disease without esophagitis: Secondary | ICD-10-CM | POA: Diagnosis present

## 2021-07-02 DIAGNOSIS — O4703 False labor before 37 completed weeks of gestation, third trimester: Secondary | ICD-10-CM | POA: Insufficient documentation

## 2021-07-02 DIAGNOSIS — O1490 Unspecified pre-eclampsia, unspecified trimester: Secondary | ICD-10-CM | POA: Diagnosis present

## 2021-07-02 HISTORY — DX: Gastro-esophageal reflux disease without esophagitis: K21.9

## 2021-07-02 LAB — COMPREHENSIVE METABOLIC PANEL
ALT: 23 U/L (ref 0–44)
ALT: 22 U/L (ref 0–44)
AST: 27 U/L (ref 15–41)
AST: 26 U/L (ref 15–41)
Albumin: 2.7 g/dL — ABNORMAL LOW (ref 3.5–5.0)
Albumin: 2.8 g/dL — ABNORMAL LOW (ref 3.5–5.0)
Alkaline Phosphatase: 173 U/L — ABNORMAL HIGH (ref 38–126)
Alkaline Phosphatase: 170 U/L — ABNORMAL HIGH (ref 38–126)
Anion gap: 11 (ref 5–15)
Anion gap: 13 (ref 5–15)
BUN: 11 mg/dL (ref 6–20)
BUN: 11 mg/dL (ref 6–20)
CO2: 19 mmol/L — ABNORMAL LOW (ref 22–32)
CO2: 17 mmol/L — ABNORMAL LOW (ref 22–32)
Calcium: 8.6 mg/dL — ABNORMAL LOW (ref 8.9–10.3)
Calcium: 8.8 mg/dL — ABNORMAL LOW (ref 8.9–10.3)
Chloride: 103 mmol/L (ref 98–111)
Chloride: 105 mmol/L (ref 98–111)
Creatinine, Ser: 0.93 mg/dL (ref 0.44–1.00)
Creatinine, Ser: 0.95 mg/dL (ref 0.44–1.00)
GFR, Estimated: >60 mL/min (ref >60)
GFR, Estimated: >60 mL/min (ref >60)
Glucose, Bld: 72 mg/dL (ref 70–99)
Glucose, Bld: 64 mg/dL — ABNORMAL LOW (ref 70–99)
Potassium: 3.7 mmol/L (ref 3.5–5.1)
Potassium: 3.6 mmol/L (ref 3.5–5.1)
Sodium: 133 mmol/L — ABNORMAL LOW (ref 135–145)
Sodium: 135 mmol/L (ref 135–145)
Total Bilirubin: 0.8 mg/dL (ref 0.3–1.2)
Total Bilirubin: 0.8 mg/dL (ref 0.3–1.2)
Total Protein: 5.8 g/dL — ABNORMAL LOW (ref 6.5–8.1)
Total Protein: 5.8 g/dL — ABNORMAL LOW (ref 6.5–8.1)

## 2021-07-02 LAB — URINALYSIS, ROUTINE W REFLEX MICROSCOPIC
Bilirubin Urine: NEGATIVE
Glucose, UA: NEGATIVE mg/dL
Ketones, ur: >80 mg/dL — AB
Leukocytes,Ua: NEGATIVE
Nitrite: NEGATIVE
Protein, ur: >300 mg/dL — AB
Specific Gravity, Urine: >1.03 — ABNORMAL HIGH (ref 1.005–1.030)
pH: 6 (ref 5.0–8.0)

## 2021-07-02 LAB — WET PREP, GENITAL
Clue Cells Wet Prep HPF POC: NONE SEEN
Sperm: NONE SEEN
Trich, Wet Prep: NONE SEEN
Yeast Wet Prep HPF POC: NONE SEEN

## 2021-07-02 LAB — CBC
HCT: 31.7 % — ABNORMAL LOW (ref 36.0–46.0)
HCT: 30.4 % — ABNORMAL LOW (ref 36.0–46.0)
Hemoglobin: 10.9 g/dL — ABNORMAL LOW (ref 12.0–15.0)
Hemoglobin: 10.4 g/dL — ABNORMAL LOW (ref 12.0–15.0)
MCH: 30.7 pg (ref 26.0–34.0)
MCH: 30.3 pg (ref 26.0–34.0)
MCHC: 34.4 g/dL (ref 30.0–36.0)
MCHC: 34.2 g/dL (ref 30.0–36.0)
MCV: 89.3 fL (ref 80.0–100.0)
MCV: 88.6 fL (ref 80.0–100.0)
Platelets: 195 10*3/uL (ref 150–400)
Platelets: 196 10*3/uL (ref 150–400)
RBC: 3.55 MIL/uL — ABNORMAL LOW (ref 3.87–5.11)
RBC: 3.43 MIL/uL — ABNORMAL LOW (ref 3.87–5.11)
RDW: 12.9 % (ref 11.5–15.5)
RDW: 13.2 % (ref 11.5–15.5)
WBC: 6 10*3/uL (ref 4.0–10.5)
WBC: 6.3 10*3/uL (ref 4.0–10.5)
nRBC: 0 % (ref 0.0–0.2)
nRBC: 0 % (ref 0.0–0.2)

## 2021-07-02 LAB — GC/CHLAMYDIA PROBE AMP (~~LOC~~) NOT AT ARMC
Chlamydia: NEGATIVE
Comment: NEGATIVE
Comment: NORMAL
Neisseria Gonorrhea: NEGATIVE

## 2021-07-02 LAB — TYPE AND SCREEN
ABO/RH(D): AB POS
Antibody Screen: NEGATIVE

## 2021-07-02 LAB — URINALYSIS, MICROSCOPIC (REFLEX)

## 2021-07-02 LAB — PROTEIN / CREATININE RATIO, URINE
Creatinine, Urine: 427.13 mg/dL
Protein Creatinine Ratio: 0.86 mg/mg{Cre} — ABNORMAL HIGH (ref 0.00–0.15)
Total Protein, Urine: 366 mg/dL

## 2021-07-02 LAB — SARS CORONAVIRUS 2 (TAT 6-24 HRS): SARS Coronavirus 2: NEGATIVE

## 2021-07-02 MED ORDER — SODIUM CHLORIDE 0.9% FLUSH
3.0000 mL | INTRAVENOUS | Status: DC | PRN
Start: 2021-07-02 — End: 2021-07-05

## 2021-07-02 MED ORDER — DOCUSATE SODIUM 100 MG PO CAPS
100.0000 mg | ORAL_CAPSULE | Freq: Every day | ORAL | Status: DC
  Administered 2021-07-03 – 2021-07-04 (×2): 100 mg via ORAL
  Filled 2021-07-02 (×2): qty 1

## 2021-07-02 MED ORDER — ACETAMINOPHEN 325 MG PO TABS
650.0000 mg | ORAL_TABLET | ORAL | Status: DC | PRN
  Administered 2021-07-02: 650 mg via ORAL
  Filled 2021-07-02: qty 2

## 2021-07-02 MED ORDER — LACTATED RINGERS IV BOLUS
1000.0000 mL | Freq: Once | INTRAVENOUS | Status: AC
  Administered 2021-07-02: 1000 mL via INTRAVENOUS

## 2021-07-02 MED ORDER — BETAMETHASONE SOD PHOS & ACET 6 (3-3) MG/ML IJ SUSP
12.0000 mg | INTRAMUSCULAR | Status: AC
  Administered 2021-07-02 – 2021-07-03 (×2): 12 mg via INTRAMUSCULAR
  Filled 2021-07-02: qty 5

## 2021-07-02 MED ORDER — HYDRALAZINE HCL 20 MG/ML IJ SOLN
5.0000 mg | INTRAMUSCULAR | Status: DC | PRN
  Administered 2021-07-04: 5 mg via INTRAVENOUS
  Filled 2021-07-02: qty 1

## 2021-07-02 MED ORDER — LABETALOL HCL 5 MG/ML IV SOLN
20.0000 mg | INTRAVENOUS | Status: DC | PRN
Start: 2021-07-02 — End: 2021-07-05
  Administered 2021-07-04: 20 mg via INTRAVENOUS
  Filled 2021-07-02: qty 4

## 2021-07-02 MED ORDER — CALCIUM CARBONATE ANTACID 500 MG PO CHEW
2.0000 | CHEWABLE_TABLET | ORAL | Status: DC | PRN
  Administered 2021-07-02: 400 mg via ORAL
  Filled 2021-07-02: qty 2

## 2021-07-02 MED ORDER — SODIUM CHLORIDE 0.9 % IV SOLN: 250.0000 mL | INTRAVENOUS | Status: DC | PRN

## 2021-07-02 MED ORDER — LABETALOL HCL 5 MG/ML IV SOLN: 40.0000 mg | INTRAVENOUS | Status: DC | PRN

## 2021-07-02 MED ORDER — NIFEDIPINE 10 MG PO CAPS
10.0000 mg | ORAL_CAPSULE | ORAL | Status: AC
  Administered 2021-07-02 (×3): 10 mg via ORAL
  Filled 2021-07-02 (×3): qty 1

## 2021-07-02 MED ORDER — GLYCOPYRROLATE 1 MG PO TABS
1.0000 mg | ORAL_TABLET | Freq: Three times a day (TID) | ORAL | Status: DC
  Administered 2021-07-03 – 2021-07-04 (×3): 1 mg via ORAL
  Filled 2021-07-02 (×7): qty 1

## 2021-07-02 MED ORDER — PRENATAL MULTIVITAMIN CH
1.0000 | ORAL_TABLET | Freq: Every day | ORAL | Status: DC
  Administered 2021-07-03 – 2021-07-04 (×2): 1 via ORAL
  Filled 2021-07-02 (×2): qty 1

## 2021-07-02 MED ORDER — SODIUM CHLORIDE 0.9% FLUSH
3.0000 mL | Freq: Two times a day (BID) | INTRAVENOUS | Status: DC
  Administered 2021-07-03 – 2021-07-04 (×3): 3 mL via INTRAVENOUS

## 2021-07-02 MED ORDER — ZOLPIDEM TARTRATE 5 MG PO TABS: 5.0000 mg | ORAL_TABLET | Freq: Every evening | ORAL | Status: DC | PRN

## 2021-07-02 MED ORDER — FAMOTIDINE 20 MG PO TABS
20.0000 mg | ORAL_TABLET | Freq: Two times a day (BID) | ORAL | Status: DC
  Administered 2021-07-03 – 2021-07-04 (×3): 20 mg via ORAL
  Filled 2021-07-02 (×3): qty 1

## 2021-07-02 MED ORDER — HYDRALAZINE HCL 20 MG/ML IJ SOLN
10.0000 mg | INTRAMUSCULAR | Status: DC | PRN
  Administered 2021-07-04: 10 mg via INTRAVENOUS
  Filled 2021-07-02: qty 1

## 2021-07-02 NOTE — H&P (Signed)
Katrina Cain is a 25 y.o. female P0 at [redacted]w[redacted]d previously diagnosed with preeclampsia presenting for betamethasone. Patient was seen in the office earlier today and reported a headache not relieved by tylenol and BP as high as 161/99. Patient reports good fetal movement. She denies leakage of fluid, vaginal bleeding, or contractions.    OB History     Gravida  1   Para  0   Term  0   Preterm  0   AB  0   Living  0      SAB  0   IAB  0   Ectopic  0   Multiple  0   Live Births  0          Past Medical History:  Diagnosis Date   Asthma    GERD (gastroesophageal reflux disease)    Vaginal Pap smear, abnormal    Past Surgical History:  Procedure Laterality Date   NO PAST SURGERIES     Family History: family history includes Cancer in her father and paternal grandmother; Diabetes in her mother; Hypertension in her mother. Social History:  reports that she has quit smoking. Her smoking use included cigarettes. She has never used smokeless tobacco. She reports that she does not currently use drugs after having used the following drugs: Marijuana. She reports that she does not drink alcohol.     Maternal Diabetes: No Genetic Screening: Normal Maternal Ultrasounds/Referrals: Normal Fetal Ultrasounds or other Referrals:  None Maternal Substance Abuse:  No Significant Maternal Medications:  None Significant Maternal Lab Results:  None Other Comments:  None  Review of Systems See pertinent in HPI. All other systems reviewed and non contributory History   Blood pressure (!) 155/89, pulse (!) 112, temperature 97.9 F (36.6 C), temperature source Oral, resp. rate 16, last menstrual period 11/07/2020, SpO2 100 %. Exam Physical Exam  GENERAL: Well-developed, well-nourished female in no acute distress.  LUNGS: Clear to auscultation bilaterally.  HEART: Regular rate and rhythm. ABDOMEN: Soft, nontender, nondistended. No organomegaly. PELVIC: Deferred EXTREMITIES:  No cyanosis, clubbing, or edema, 2+ distal pulses.  Prenatal labs: ABO, Rh: AB/Positive/-- (04/11 1434) Antibody: Negative (04/11 1434) Rubella: 5.13 (04/11 1434) RPR: Non Reactive (07/29 0850)  HBsAg: Negative (04/11 1434)  HIV: Non Reactive (07/29 0850)  GBS:     Assessment/Plan: 26 yo with preeclampsia at [redacted]w[redacted]d - Will admit for observation - Betamethasone  - growth ultrasound  Tavio Biegel 07/02/2021, 5:10 PM

## 2021-07-02 NOTE — MAU Provider Note (Signed)
History     CSN: [id number]  Arrival date and time: 07/02/21 0334   Event Date/Time   First Provider Initiated Contact with Patient 07/02/21 0439       24 y.o. G1 @33 .6 wks with known PEC presenting with ctx. Reports onset around 3am. Reports pain is in her LLQ and back on left side. Denies VB or LOF. Reports milky vaginal discharge w/o itching or malodor. Denies urinary sx. Reports +FM. Denies HA, visual disturbances, RUQ pain, SOB, and CP.    OB History     Gravida  1   Para  0   Term  0   Preterm  0   AB  0   Living  0      SAB  0   IAB  0   Ectopic  0   Multiple  0   Live Births  0           Past Medical History:  Diagnosis Date   Asthma    GERD (gastroesophageal reflux disease)    Vaginal Pap smear, abnormal     Past Surgical History:  Procedure Laterality Date   NO PAST SURGERIES      Family History  Problem Relation Age of Onset   Diabetes Mother    Hypertension Mother    Cancer Father    Cancer Paternal Grandmother     Social History   Tobacco Use   Smoking status: Former    Types: Cigarettes   Smokeless tobacco: Never  Vaping Use   Vaping Use: Never used  Substance Use Topics   Alcohol use: No   Drug use: Not Currently    Types: Marijuana    Comment: Stopped when pregnancy confirmed    Allergies: No Known Allergies  Medications Prior to Admission  Medication Sig Dispense Refill Last Dose   cyclobenzaprine (FLEXERIL) 10 MG tablet Take 1 tablet (10 mg total) by mouth every 8 (eight) hours as needed for muscle spasms. 30 tablet 0 Past Week   famotidine (PEPCID) 20 MG tablet Take 1 tablet (20 mg total) by mouth 2 (two) times daily. 60 tablet 0 07/01/2021   glycopyrrolate (ROBINUL) 1 MG tablet Take 1 tablet (1 mg total) by mouth 3 (three) times daily. 60 tablet 0 07/01/2021   Doxylamine-Pyridoxine 10-10 MG TBEC Take by mouth.      metoCLOPramide (REGLAN) 10 MG tablet Take 1 tablet (10 mg total) by mouth 3 (three) times daily  with meals as needed for nausea. 30 tablet 2    Prenat-Fe Carbonyl-FA-Omega 3 (ONE-A-DAY WOMENS PRENATAL 1 PO) Take by mouth.       Review of Systems  Eyes:  Negative for visual disturbance.  Respiratory:  Negative for shortness of breath.   Cardiovascular:  Negative for chest pain.  Gastrointestinal:  Positive for abdominal pain.  Genitourinary:  Positive for vaginal discharge. Negative for dysuria, hematuria, urgency and vaginal bleeding.  Musculoskeletal:  Positive for back pain.  Neurological:  Negative for headaches.  Physical Exam   Blood pressure 126/74, pulse (!) 120, temperature 98.2 F (36.8 C), temperature source Oral, resp. rate 16, last menstrual period 11/07/2020, SpO2 100 %. Patient Vitals for the past 24 hrs:  BP Temp Temp src Pulse Resp SpO2  07/02/21 0600 126/74 98.2 F (36.8 C) Oral (!) 120 16 100 %  07/02/21 0555 -- -- -- -- -- 100 %  07/02/21 0546 133/85 -- -- -- -- --  07/02/21 0545 133/85 -- -- 93 -- 100 %  07/02/21 0540 -- -- -- -- -- 100 %  07/02/21 0535 -- -- -- -- -- 100 %  07/02/21 0531 133/88 -- -- 79 -- --  07/02/21 0530 -- -- -- -- -- 100 %  07/02/21 0518 136/87 -- -- -- -- --  07/02/21 0501 (!) 136/96 -- -- 78 -- --  07/02/21 0458 (!) 150/101 -- -- -- -- --  07/02/21 0449 -- -- -- -- -- 100 %  07/02/21 0445 -- -- -- 95 -- 100 %  07/02/21 0430 (!) 138/96 -- -- 88 -- 100 %  07/02/21 0425 -- -- -- -- -- 100 %  07/02/21 0424 (!) 137/98 -- -- 87 -- --  07/02/21 0420 -- -- -- -- -- 100 %  07/02/21 0416 (!) 138/99 -- -- 94 -- --  07/02/21 0415 (!) 144/97 98.4 F (36.9 C) Oral 73 16 100 %  07/02/21 0410 -- -- -- -- -- 100 %  07/02/21 0405 -- -- -- -- -- 100 %    Physical Exam Vitals and nursing note reviewed. Exam conducted with a chaperone present.  Constitutional:      General: She is not in acute distress.    Appearance: Normal appearance.  HENT:     Head: Normocephalic and atraumatic.  Cardiovascular:     Rate and Rhythm: Normal rate.   Pulmonary:     Effort: Pulmonary effort is normal. No respiratory distress.  Abdominal:     Palpations: Abdomen is soft.     Tenderness: There is no abdominal tenderness.  Genitourinary:    Comments: VE: closed/thick Musculoskeletal:     Cervical back: Normal range of motion.  Skin:    General: Skin is warm and dry.  Neurological:     General: No focal deficit present.     Mental Status: She is alert and oriented to person, place, and time.  Psychiatric:        Mood and Affect: Mood normal.        Behavior: Behavior normal.  EFM: 145 bpm, mod variability, + accels, no decels Toco: q4  Results for orders placed or performed during the hospital encounter of 07/02/21 (from the past 24 hour(s))  Urinalysis, Routine w reflex microscopic Urine, Clean Catch     Status: Abnormal   Collection Time: 07/02/21  4:04 AM  Result Value Ref Range   Color, Urine YELLOW YELLOW   APPearance CLOUDY (A) CLEAR   Specific Gravity, Urine >1.030 (H) 1.005 - 1.030   pH 6.0 5.0 - 8.0   Glucose, UA NEGATIVE NEGATIVE mg/dL   Hgb urine dipstick SMALL (A) NEGATIVE   Bilirubin Urine NEGATIVE NEGATIVE   Ketones, ur >80 (A) NEGATIVE mg/dL   Protein, ur >782 (A) NEGATIVE mg/dL   Nitrite NEGATIVE NEGATIVE   Leukocytes,Ua NEGATIVE NEGATIVE  Urinalysis, Microscopic (reflex)     Status: Abnormal   Collection Time: 07/02/21  4:04 AM  Result Value Ref Range   RBC / HPF 6-10 0 - 5 RBC/hpf   WBC, UA 6-10 0 - 5 WBC/hpf   Bacteria, UA FEW (A) NONE SEEN   Squamous Epithelial / LPF 21-50 0 - 5   Mucus PRESENT   CBC     Status: Abnormal   Collection Time: 07/02/21  4:53 AM  Result Value Ref Range   WBC 6.0 4.0 - 10.5 K/uL   RBC 3.55 (L) 3.87 - 5.11 MIL/uL   Hemoglobin 10.9 (L) 12.0 - 15.0 g/dL   HCT 95.6 (L) 21.3 - 08.6 %  MCV 89.3 80.0 - 100.0 fL   MCH 30.7 26.0 - 34.0 pg   MCHC 34.4 30.0 - 36.0 g/dL   RDW 26.2 03.5 - 59.7 %   Platelets 195 150 - 400 K/uL   nRBC 0.0 0.0 - 0.2 %  Comprehensive metabolic  panel     Status: Abnormal   Collection Time: 07/02/21  4:53 AM  Result Value Ref Range   Sodium 133 (L) 135 - 145 mmol/L   Potassium 3.7 3.5 - 5.1 mmol/L   Chloride 103 98 - 111 mmol/L   CO2 19 (L) 22 - 32 mmol/L   Glucose, Bld 72 70 - 99 mg/dL   BUN 11 6 - 20 mg/dL   Creatinine, Ser 4.16 0.44 - 1.00 mg/dL   Calcium 8.6 (L) 8.9 - 10.3 mg/dL   Total Protein 5.8 (L) 6.5 - 8.1 g/dL   Albumin 2.7 (L) 3.5 - 5.0 g/dL   AST 27 15 - 41 U/L   ALT 23 0 - 44 U/L   Alkaline Phosphatase 173 (H) 38 - 126 U/L   Total Bilirubin 0.8 0.3 - 1.2 mg/dL   GFR, Estimated >38 >45 mL/min   Anion gap 11 5 - 15  Wet prep, genital     Status: Abnormal   Collection Time: 07/02/21  4:55 AM   Specimen: PATH Cytology Cervicovaginal Ancillary Only  Result Value Ref Range   Yeast Wet Prep HPF POC NONE SEEN NONE SEEN   Trich, Wet Prep NONE SEEN NONE SEEN   Clue Cells Wet Prep HPF POC NONE SEEN NONE SEEN   WBC, Wet Prep HPF POC MANY (A) NONE SEEN   Sperm NONE SEEN    MAU Course  Procedures LR Pepcid Procardia x3  MDM Labs ordered and reviewed.  0620: Feels less ctx after meds and IVF, no ctx on toco. Discussed improving hydration. Previously dx with PEC, no signs of severe features today. Stable for discharge home.   Assessment and Plan   1. Pre-eclampsia in third trimester   2. Supervision of high-risk pregnancy, third trimester   3. Preterm uterine contractions   4. [redacted] weeks gestation of pregnancy   5. NST (non-stress test) reactive   6. Preterm uterine contractions in third trimester, antepartum   7. Dehydration    Discharge home Follow up at [facility] today as scheduled PTL precautions  Allergies as of 07/02/2021   No Known Allergies      Medication List     TAKE these medications    cyclobenzaprine 10 MG tablet Commonly known as: FLEXERIL Take 1 tablet (10 mg total) by mouth every 8 (eight) hours as needed for muscle spasms.   Doxylamine-Pyridoxine 10-10 MG Tbec Take by mouth.    famotidine 20 MG tablet Commonly known as: Pepcid Take 1 tablet (20 mg total) by mouth 2 (two) times daily.   glycopyrrolate 1 MG tablet Commonly known as: Robinul Take 1 tablet (1 mg total) by mouth 3 (three) times daily.   metoCLOPramide 10 MG tablet Commonly known as: Reglan Take 1 tablet (10 mg total) by mouth 3 (three) times daily with meals as needed for nausea.   ONE-A-DAY WOMENS PRENATAL 1 PO Take by mouth.       Donette Larry, CNM 07/02/2021, 6:25 AM

## 2021-07-02 NOTE — Progress Notes (Signed)
   PRENATAL VISIT NOTE  Subjective:  Katrina Cain is a 25 y.o. G1P0000 at [redacted]w[redacted]d being seen today for ongoing prenatal care.  She is currently monitored for the following issues for this high-risk pregnancy and has Supervision of high-risk pregnancy, third trimester; HSIL (high grade squamous intraepithelial lesion) on Pap smear of cervix; Preeclampsia; Preterm uterine contractions; and GERD (gastroesophageal reflux disease) on their problem list.  Patient reports headache.  Contractions: Irregular. Vag. Bleeding: None.  Movement: Present. Denies leaking of fluid.   The following portions of the patient's history were reviewed and updated as appropriate: allergies, current medications, past family history, past medical history, past social history, past surgical history and problem list.   Objective:   Vitals:   07/02/21 1524 07/02/21 1526  BP: (!) 161/99 (!) 145/95  Pulse: (!) 102 92  Weight: 165 lb 14.4 oz (75.3 kg)     Fetal Status: Fetal Heart Rate (bpm): 152 Fundal Height: 33 cm Movement: Present     General:  Alert, oriented and cooperative. Patient is in no acute distress.  Skin: Skin is warm and dry. No rash noted.   Cardiovascular: Normal heart rate noted  Respiratory: Normal respiratory effort, no problems with respiration noted  Abdomen: Soft, gravid, appropriate for gestational age.  Pain/Pressure: Present     Pelvic: Cervical exam deferred        Extremities: Normal range of motion.  Edema: Trace  Mental Status: Normal mood and affect. Normal behavior. Normal judgment and thought content.   Assessment and Plan:  Pregnancy: G1P0000 at [redacted]w[redacted]d 1. Supervision of high-risk pregnancy, third trimester Patient with persistent headache and previous diagnosis of preeclampsia Elevated BP in the office Discussed hospital admission for observation and BMZ  2. Pre-eclampsia in third trimester   Preterm labor symptoms and general obstetric precautions including but not  limited to vaginal bleeding, contractions, leaking of fluid and fetal movement were reviewed in detail with the patient. Please refer to After Visit Summary for other counseling recommendations.   No follow-ups on file.  Future Appointments  Date Time Provider Department Center  07/05/2021  3:10 PM Gerrit Heck, CNM [facility] None    Catalina Antigua, MD

## 2021-07-02 NOTE — MAU Note (Signed)
Low abd pain started at 1:26 am.  Pain feels constant.  Left sided thigh pain toward knee.  No bleeding.  No Leaking.

## 2021-07-03 ENCOUNTER — Observation Stay (HOSPITAL_BASED_OUTPATIENT_CLINIC_OR_DEPARTMENT_OTHER): Payer: Medicaid Other

## 2021-07-03 DIAGNOSIS — O1493 Unspecified pre-eclampsia, third trimester: Secondary | ICD-10-CM | POA: Diagnosis not present

## 2021-07-03 DIAGNOSIS — O288 Other abnormal findings on antenatal screening of mother: Secondary | ICD-10-CM | POA: Diagnosis not present

## 2021-07-03 DIAGNOSIS — Z3A33 33 weeks gestation of pregnancy: Secondary | ICD-10-CM

## 2021-07-03 DIAGNOSIS — O1403 Mild to moderate pre-eclampsia, third trimester: Secondary | ICD-10-CM | POA: Diagnosis not present

## 2021-07-03 DIAGNOSIS — O0993 Supervision of high risk pregnancy, unspecified, third trimester: Secondary | ICD-10-CM | POA: Diagnosis not present

## 2021-07-03 NOTE — Progress Notes (Signed)
Patient ID: Jena Gauss, female   DOB: 18-Aug-1996, 25 y.o.   MRN: [id number] FACULTY PRACTICE ANTEPARTUM(COMPREHENSIVE) NOTE  CHARLEE SQUIBB is a 25 y.o. G1P0000 at [redacted]w[redacted]d  who is admitted for preeclampsia.    Fetal presentation is cephalic. Length of Stay:  0  Days  Date of admission:07/02/2021  Subjective: Patient reports feeling well. She reports headache relieved with tylenol. She denies any visual changes or RUQ pain Patient reports the fetal movement as active. Patient reports uterine contraction as absent this morning Patient reports  vaginal bleeding as none. Patient describes fluid per vagina as None.  Vitals:  Blood pressure (!) 142/88, pulse 100, temperature 98.4 F (36.9 C), temperature source Oral, resp. rate 16, height 5\' 8"  (1.727 m), weight 75.3 kg, last menstrual period 11/07/2020, SpO2 99 %. Vitals:   07/02/21 1700 07/02/21 1940 07/02/21 2301 07/03/21 0447  BP: (!) 155/89 (!) 157/87 (!) 144/87 (!) 142/88  Pulse: (!) 112 92 75 100  Resp: 16 16 16 16   Temp: 97.9 F (36.6 C) 97.9 F (36.6 C) 98.2 F (36.8 C) 98.4 F (36.9 C)  TempSrc: Oral Oral Oral Oral  SpO2: 100% 100% 100% 99%  Weight: 75.3 kg     Height: 5\' 8"  (1.727 m)      Physical Examination:  GENERAL: Well-developed, well-nourished female in no acute distress.  LUNGS: Clear to auscultation bilaterally.  HEART: Regular rate and rhythm. ABDOMEN: Soft, nontender, gravid PELVIC: Not indicated EXTREMITIES: No cyanosis, clubbing, or edema, 2+ distal pulses.   Fetal Monitoring:  Baseline: 135 bpm, Variability: Good {> 6 bpm), Accelerations: Reactive, and Decelerations: Absent   reactive  Labs:  Results for orders placed or performed during the hospital encounter of 07/02/21 (from the past 24 hour(s))  SARS CORONAVIRUS 2 (TAT 6-24 HRS) Nasopharyngeal Nasopharyngeal Swab   Collection Time: 07/02/21  5:10 PM   Specimen: Nasopharyngeal Swab  Result Value Ref Range   SARS Coronavirus 2 NEGATIVE  NEGATIVE  Comprehensive metabolic panel   Collection Time: 07/02/21  5:30 PM  Result Value Ref Range   Sodium 135 135 - 145 mmol/L   Potassium 3.6 3.5 - 5.1 mmol/L   Chloride 105 98 - 111 mmol/L   CO2 17 (L) 22 - 32 mmol/L   Glucose, Bld 64 (L) 70 - 99 mg/dL   BUN 11 6 - 20 mg/dL   Creatinine, Ser 07/04/21 0.44 - 1.00 mg/dL   Calcium 8.8 (L) 8.9 - 10.3 mg/dL   Total Protein 5.8 (L) 6.5 - 8.1 g/dL   Albumin 2.8 (L) 3.5 - 5.0 g/dL   AST 26 15 - 41 U/L   ALT 22 0 - 44 U/L   Alkaline Phosphatase 170 (H) 38 - 126 U/L   Total Bilirubin 0.8 0.3 - 1.2 mg/dL   GFR, Estimated 07/04/21 07/04/21 mL/min   Anion gap 13 5 - 15  CBC   Collection Time: 07/02/21  5:30 PM  Result Value Ref Range   WBC 6.3 4.0 - 10.5 K/uL   RBC 3.43 (L) 3.87 - 5.11 MIL/uL   Hemoglobin 10.4 (L) 12.0 - 15.0 g/dL   HCT >38 (L) >75 - 07/04/21 %   MCV 88.6 80.0 - 100.0 fL   MCH 30.3 26.0 - 34.0 pg   MCHC 34.2 30.0 - 36.0 g/dL   RDW 64.3 32.9 - 51.8 %   Platelets 196 150 - 400 K/uL   nRBC 0.0 0.0 - 0.2 %  Type and screen [facility]  Collection Time: 07/02/21  5:30 PM  Result Value Ref Range   ABO/RH(D) AB POS    Antibody Screen NEG    Sample Expiration      07/05/2021,2359 Performed at [facility], [address] [place], [place] [address]   Protein / creatinine ratio, urine   Collection Time: 07/02/21  7:47 PM  Result Value Ref Range   Creatinine, Urine 427.13 mg/dL   Total Protein, Urine 366 mg/dL   Protein Creatinine Ratio 0.86 (H) 0.00 - 0.15 mg/mg[Cre]    Imaging Studies:    No results found.   Medications:  Scheduled  betamethasone acetate-betamethasone sodium phosphate  12 mg Intramuscular Q24H   docusate sodium  100 mg Oral Daily   famotidine  20 mg Oral BID   glycopyrrolate  1 mg Oral Q8H   prenatal multivitamin  1 tablet Oral Q1200   sodium chloride flush  3 mL Intravenous Q12H   I have reviewed the patient's current medications.  ASSESSMENT: G1P0000 [redacted]w[redacted]d with  preeclampsia Patient Active Problem List   Diagnosis Date Noted   [redacted] weeks gestation of pregnancy 07/02/2021   Preeclampsia, third trimester 07/02/2021   Preeclampsia 06/28/2021   Preterm uterine contractions 06/28/2021   GERD (gastroesophageal reflux disease) 06/28/2021   HSIL (high grade squamous intraepithelial lesion) on Pap smear of cervix 02/28/2021   Supervision of high-risk pregnancy, third trimester 01/17/2021    PLAN: - BP and symptoms improved  - No severe BP overnight - Complete second dose of BMZ this evening - Follow up growth ultrasound report - Continue close monitoring with plans to deliver for worsening maternal condition or fetal indications - Continue current inpatient care   Vincie Linn 07/03/2021,7:50 AM

## 2021-07-04 ENCOUNTER — Encounter (HOSPITAL_COMMUNITY): Payer: Self-pay | Admitting: Obstetrics and Gynecology

## 2021-07-04 ENCOUNTER — Observation Stay (HOSPITAL_BASED_OUTPATIENT_CLINIC_OR_DEPARTMENT_OTHER): Payer: Medicaid Other

## 2021-07-04 DIAGNOSIS — O1414 Severe pre-eclampsia complicating childbirth: Secondary | ICD-10-CM | POA: Diagnosis not present

## 2021-07-04 DIAGNOSIS — K219 Gastro-esophageal reflux disease without esophagitis: Secondary | ICD-10-CM | POA: Diagnosis not present

## 2021-07-04 DIAGNOSIS — Z87891 Personal history of nicotine dependence: Secondary | ICD-10-CM | POA: Diagnosis not present

## 2021-07-04 DIAGNOSIS — O1403 Mild to moderate pre-eclampsia, third trimester: Secondary | ICD-10-CM | POA: Diagnosis not present

## 2021-07-04 DIAGNOSIS — Z3A33 33 weeks gestation of pregnancy: Secondary | ICD-10-CM

## 2021-07-04 DIAGNOSIS — Z3A34 34 weeks gestation of pregnancy: Secondary | ICD-10-CM | POA: Diagnosis not present

## 2021-07-04 DIAGNOSIS — Z20822 Contact with and (suspected) exposure to covid-19: Secondary | ICD-10-CM | POA: Diagnosis not present

## 2021-07-04 DIAGNOSIS — O1404 Mild to moderate pre-eclampsia, complicating childbirth: Secondary | ICD-10-CM | POA: Diagnosis not present

## 2021-07-04 DIAGNOSIS — O9962 Diseases of the digestive system complicating childbirth: Secondary | ICD-10-CM | POA: Diagnosis not present

## 2021-07-04 LAB — CBC
HCT: 30.5 % — ABNORMAL LOW (ref 36.0–46.0)
HCT: 31.4 % — ABNORMAL LOW (ref 36.0–46.0)
Hemoglobin: 10.9 g/dL — ABNORMAL LOW (ref 12.0–15.0)
Hemoglobin: 11 g/dL — ABNORMAL LOW (ref 12.0–15.0)
MCH: 31.5 pg (ref 26.0–34.0)
MCH: 30.9 pg (ref 26.0–34.0)
MCHC: 35.7 g/dL (ref 30.0–36.0)
MCHC: 35 g/dL (ref 30.0–36.0)
MCV: 88.2 fL (ref 80.0–100.0)
MCV: 88.2 fL (ref 80.0–100.0)
Platelets: 200 10*3/uL (ref 150–400)
Platelets: 229 10*3/uL (ref 150–400)
RBC: 3.46 MIL/uL — ABNORMAL LOW (ref 3.87–5.11)
RBC: 3.56 MIL/uL — ABNORMAL LOW (ref 3.87–5.11)
RDW: 13.6 % (ref 11.5–15.5)
RDW: 13.7 % (ref 11.5–15.5)
WBC: 10.1 10*3/uL (ref 4.0–10.5)
WBC: 13.6 10*3/uL — ABNORMAL HIGH (ref 4.0–10.5)
nRBC: 0 % (ref 0.0–0.2)
nRBC: 0.1 % (ref 0.0–0.2)

## 2021-07-04 LAB — COMPREHENSIVE METABOLIC PANEL
ALT: 22 U/L (ref 0–44)
ALT: 24 U/L (ref 0–44)
AST: 25 U/L (ref 15–41)
AST: 24 U/L (ref 15–41)
Albumin: 2.7 g/dL — ABNORMAL LOW (ref 3.5–5.0)
Albumin: 2.8 g/dL — ABNORMAL LOW (ref 3.5–5.0)
Alkaline Phosphatase: 176 U/L — ABNORMAL HIGH (ref 38–126)
Alkaline Phosphatase: 181 U/L — ABNORMAL HIGH (ref 38–126)
Anion gap: 13 (ref 5–15)
Anion gap: 14 (ref 5–15)
BUN: 21 mg/dL — ABNORMAL HIGH (ref 6–20)
BUN: 19 mg/dL (ref 6–20)
CO2: 18 mmol/L — ABNORMAL LOW (ref 22–32)
CO2: 16 mmol/L — ABNORMAL LOW (ref 22–32)
Calcium: 8.6 mg/dL — ABNORMAL LOW (ref 8.9–10.3)
Calcium: 8.4 mg/dL — ABNORMAL LOW (ref 8.9–10.3)
Chloride: 104 mmol/L (ref 98–111)
Chloride: 105 mmol/L (ref 98–111)
Creatinine, Ser: 1.11 mg/dL — ABNORMAL HIGH (ref 0.44–1.00)
Creatinine, Ser: 1.13 mg/dL — ABNORMAL HIGH (ref 0.44–1.00)
GFR, Estimated: >60 mL/min (ref >60)
GFR, Estimated: >60 mL/min (ref >60)
Glucose, Bld: 92 mg/dL (ref 70–99)
Glucose, Bld: 105 mg/dL — ABNORMAL HIGH (ref 70–99)
Potassium: 3.8 mmol/L (ref 3.5–5.1)
Potassium: 3.6 mmol/L (ref 3.5–5.1)
Sodium: 135 mmol/L (ref 135–145)
Sodium: 135 mmol/L (ref 135–145)
Total Bilirubin: 0.8 mg/dL (ref 0.3–1.2)
Total Bilirubin: 1 mg/dL (ref 0.3–1.2)
Total Protein: 5.8 g/dL — ABNORMAL LOW (ref 6.5–8.1)
Total Protein: 6 g/dL — ABNORMAL LOW (ref 6.5–8.1)

## 2021-07-04 LAB — CULTURE, BETA STREP (GROUP B ONLY)

## 2021-07-04 LAB — GROUP B STREP BY PCR: Group B strep by PCR: NEGATIVE

## 2021-07-04 LAB — MAGNESIUM: Magnesium: 5.1 mg/dL — ABNORMAL HIGH (ref 1.7–2.4)

## 2021-07-04 MED ORDER — ONDANSETRON HCL 4 MG/2ML IJ SOLN: 4.0000 mg | Freq: Four times a day (QID) | INTRAMUSCULAR | Status: DC | PRN

## 2021-07-04 MED ORDER — LIDOCAINE HCL (PF) 1 % IJ SOLN: 30.0000 mL | INTRAMUSCULAR | Status: DC | PRN

## 2021-07-04 MED ORDER — LACTATED RINGERS IV SOLN: INTRAVENOUS | Status: DC

## 2021-07-04 MED ORDER — TERBUTALINE SULFATE 1 MG/ML IJ SOLN: 0.2500 mg | Freq: Once | INTRAMUSCULAR | Status: DC | PRN

## 2021-07-04 MED ORDER — MAGNESIUM SULFATE BOLUS VIA INFUSION: 4.0000 g | Freq: Once | INTRAVENOUS | Status: DC

## 2021-07-04 MED ORDER — MAGNESIUM SULFATE 40 GM/1000ML IV SOLN: 1.0000 g/h | INTRAVENOUS | Status: DC

## 2021-07-04 MED ORDER — MAGNESIUM SULFATE BOLUS VIA INFUSION
4.0000 g | Freq: Once | INTRAVENOUS | Status: AC
  Administered 2021-07-04: 4 g via INTRAVENOUS
  Filled 2021-07-04: qty 1000

## 2021-07-04 MED ORDER — LABETALOL HCL 5 MG/ML IV SOLN: 20.0000 mg | INTRAVENOUS | Status: DC | PRN

## 2021-07-04 MED ORDER — OXYTOCIN-SODIUM CHLORIDE 30-0.9 UT/500ML-% IV SOLN
1.0000 m[IU]/min | INTRAVENOUS | Status: DC
  Filled 2021-07-04 (×2): qty 500

## 2021-07-04 MED ORDER — SOD CITRATE-CITRIC ACID 500-334 MG/5ML PO SOLN
30.0000 mL | ORAL | Status: DC | PRN
  Administered 2021-07-05: 30 mL via ORAL
  Filled 2021-07-04: qty 30

## 2021-07-04 MED ORDER — SODIUM CHLORIDE 0.9 % IV SOLN
5.0000 10*6.[IU] | Freq: Once | INTRAVENOUS | Status: AC
  Administered 2021-07-04: 5 10*6.[IU] via INTRAVENOUS
  Filled 2021-07-04 (×2): qty 5

## 2021-07-04 MED ORDER — MAGNESIUM SULFATE 40 GM/1000ML IV SOLN: 2.0000 g/h | INTRAVENOUS | Status: DC

## 2021-07-04 MED ORDER — LACTATED RINGERS IV SOLN: 500.0000 mL | INTRAVENOUS | Status: DC | PRN

## 2021-07-04 MED ORDER — LABETALOL HCL 5 MG/ML IV SOLN: 80.0000 mg | INTRAVENOUS | Status: DC | PRN

## 2021-07-04 MED ORDER — FENTANYL CITRATE (PF) 100 MCG/2ML IJ SOLN
50.0000 ug | INTRAMUSCULAR | Status: DC | PRN
  Administered 2021-07-04: 100 ug via INTRAVENOUS
  Filled 2021-07-04: qty 2

## 2021-07-04 MED ORDER — OXYCODONE-ACETAMINOPHEN 5-325 MG PO TABS
1.0000 | ORAL_TABLET | ORAL | Status: DC | PRN
Start: 2021-07-04 — End: 2021-07-05

## 2021-07-04 MED ORDER — OXYTOCIN-SODIUM CHLORIDE 30-0.9 UT/500ML-% IV SOLN: 2.5000 [IU]/h | INTRAVENOUS | Status: DC

## 2021-07-04 MED ORDER — MISOPROSTOL 25 MCG QUARTER TABLET: 25.0000 ug | ORAL_TABLET | ORAL | Status: DC | PRN

## 2021-07-04 MED ORDER — OXYTOCIN-SODIUM CHLORIDE 30-0.9 UT/500ML-% IV SOLN
1.0000 m[IU]/min | INTRAVENOUS | Status: DC
  Administered 2021-07-04: 1 m[IU]/min via INTRAVENOUS

## 2021-07-04 MED ORDER — HYDRALAZINE HCL 20 MG/ML IJ SOLN: 10.0000 mg | INTRAMUSCULAR | Status: DC | PRN

## 2021-07-04 MED ORDER — MAGNESIUM SULFATE 40 GM/1000ML IV SOLN
INTRAVENOUS | Status: AC
  Filled 2021-07-04: qty 1000

## 2021-07-04 MED ORDER — MISOPROSTOL 50MCG HALF TABLET
50.0000 ug | ORAL_TABLET | ORAL | Status: DC | PRN
  Administered 2021-07-04: 50 ug via BUCCAL
  Filled 2021-07-04: qty 1

## 2021-07-04 MED ORDER — OXYTOCIN BOLUS FROM INFUSION: 333.0000 mL | Freq: Once | INTRAVENOUS | Status: DC

## 2021-07-04 MED ORDER — PENICILLIN G POT IN DEXTROSE 60000 UNIT/ML IV SOLN
3.0000 10*6.[IU] | INTRAVENOUS | Status: DC
Start: 2021-07-04 — End: 2021-07-04

## 2021-07-04 MED ORDER — LABETALOL HCL 5 MG/ML IV SOLN: 40.0000 mg | INTRAVENOUS | Status: DC | PRN

## 2021-07-04 NOTE — Progress Notes (Signed)
Labor Progress Note Katrina Cain is a 25 y.o. G1P0000 at [redacted]w[redacted]d who presented for IOL due to severe pre-E and BPP 4/10.   S: Patient is doing well overall. No concerns. Family at bedside.  O:  BP (!) 153/87   Pulse (!) 113   Temp 98.2 F (36.8 C) (Oral)   Resp 16   Ht 5\' 8"  (1.727 m)   Wt 75.3 kg   LMP 11/07/2020 (Exact Date)   SpO2 99%   BMI 25.24 kg/m   EFM: Baseline 130 bpm, minimal variability, no accels, no decels   CVE: Dilation: 2 Effacement (%): 50 Station: -3 Presentation: Vertex Exam by:: Dr. [email or web address]   A&P: 25 y.o. G1P0000 [redacted]w[redacted]d   #Labor: Patient has made good change since last check after Cytotec x1. Foley balloon placed this check. Will start Pitocin 1x1 and reassess. #Pain: PRN; IV Fentanyl given after FB placement #FWB: Cat 2 due to minimal variability. No significant change from prior. Will continue to monitor closely.  #GBS negative; PCN discontinued   #Pre-eclampsia with severe features: Asymptomatic at this time. Continues to have mild range BP. Repeat CMP with creatinine of 1.13; similar to prior of 1.11. Normal LFTs. CBC with platelets of 229. Mg level 5.1. Will continue to monitor closely and plan to repeat labs in AM.  [redacted]w[redacted]d, MD 11:48 PM

## 2021-07-04 NOTE — Progress Notes (Signed)
FACULTY PRACTICE ANTEPARTUM PROGRESS NOTE  Katrina Cain is a 25 y.o. G1P0000 at [redacted]w[redacted]d who is admitted for preeclampsia.  Estimated Date of Delivery: 08/14/21 Fetal presentation is cephalic.  Length of Stay:  0 Days. Admitted 07/02/2021  Subjective: Pt seen, doing well this AM.  Pt has had a BPP of 4/8 today and MFM recommends  Patient reports normal fetal movement.  She denies uterine contractions, denies bleeding and leaking of fluid per vagina.  Vitals:  Blood pressure (!) 150/89, pulse 95, temperature 97.6 F (36.4 C), temperature source Oral, resp. rate 15, height 5\' 8"  (1.727 m), weight 75.3 kg, last menstrual period 11/07/2020, SpO2 100 %. Physical Examination: CONSTITUTIONAL: Well-developed, well-nourished female in no acute distress.  HENT:  Normocephalic, atraumatic, External right and left ear normal. Oropharynx is clear and moist EYES: Conjunctivae and EOM are normal.  NECK: Normal range of motion, supple, no masses. SKIN: Skin is warm and dry. No rash noted. Not diaphoretic. No erythema. No pallor. NEUROLGIC: Alert and oriented to person, place, and time. Normal reflexes, muscle tone coordination. No cranial nerve deficit noted. PSYCHIATRIC: Normal mood and affect. Normal behavior. Normal judgment and thought content. CARDIOVASCULAR: Normal heart rate noted, regular rhythm RESPIRATORY: Effort and breath sounds normal, no problems with respiration noted MUSCULOSKELETAL: Normal range of motion. No edema and no tenderness. ABDOMEN: Soft, nontender, nondistended, gravid. CERVIX: deferred  Fetal monitoring: BPP  4/8  Results for orders placed or performed during the hospital encounter of 07/02/21 (from the past 48 hour(s))  SARS CORONAVIRUS 2 (TAT 6-24 HRS) Nasopharyngeal Nasopharyngeal Swab     Status: None   Collection Time: 07/02/21  5:10 PM   Specimen: Nasopharyngeal Swab  Result Value Ref Range   SARS Coronavirus 2 NEGATIVE NEGATIVE    Comment: (NOTE) SARS-CoV-2  target nucleic acids are NOT DETECTED.  The SARS-CoV-2 RNA is generally detectable in upper and lower respiratory specimens during the acute phase of infection. Negative results do not preclude SARS-CoV-2 infection, do not rule out co-infections with other pathogens, and should not be used as the sole basis for treatment or other patient management decisions. Negative results must be combined with clinical observations, patient history, and epidemiological information. The expected result is Negative.  Fact Sheet for Patients: 07/04/21  Fact Sheet for Healthcare Providers: HairSlick.no  This test is not yet approved or cleared by the [employer]ierodirigir.[employer] FDA and  has been authorized for detection and/or diagnosis of SARS-CoV-2 by FDA under an Emergency Use Authorization (EUA). This EUA will remain  in effect (meaning this test can be used) for the duration of the COVID-19 declaration under Se ction 564(b)(1) of the Act, 21 U.S.C. section 360bbb-3(b)(1), unless the authorization is terminated or revoked sooner.  Performed at [facility], [address] [place], [place]   Type and screen [facility]     Status: None   Collection Time: 07/02/21  5:30 PM  Result Value Ref Range   ABO/RH(D) AB POS    Antibody Screen NEG    Sample Expiration      07/05/2021,2359 Performed at [facility], [address] [place], [place]   Comprehensive metabolic panel     Status: Abnormal   Collection Time: 07/02/21  5:30 PM  Result Value Ref Range   Sodium 135 135 - 145 mmol/L   Potassium 3.6 3.5 - 5.1 mmol/L   Chloride 105 98 - 111 mmol/L   CO2 17 (L) 22 - 32  mmol/L   Glucose, Bld 64 (L) 70 - 99 mg/dL    Comment: Glucose reference range applies only to samples taken after fasting for at least 8 hours.   BUN 11 6 - 20 mg/dL   Creatinine, Ser 6.21 0.44 - 1.00 mg/dL   Calcium 8.8 (L) 8.9  - 10.3 mg/dL   Total Protein 5.8 (L) 6.5 - 8.1 g/dL   Albumin 2.8 (L) 3.5 - 5.0 g/dL   AST 26 15 - 41 U/L   ALT 22 0 - 44 U/L   Alkaline Phosphatase 170 (H) 38 - 126 U/L   Total Bilirubin 0.8 0.3 - 1.2 mg/dL   GFR, Estimated >30 >86 mL/min    Comment: (NOTE) Calculated using the CKD-EPI Creatinine Equation (2021)    Anion gap 13 5 - 15    Comment: Performed at [facility], [address], [place], [place] [address]  CBC     Status: Abnormal   Collection Time: 07/02/21  5:30 PM  Result Value Ref Range   WBC 6.3 4.0 - 10.5 K/uL   RBC 3.43 (L) 3.87 - 5.11 MIL/uL   Hemoglobin 10.4 (L) 12.0 - 15.0 g/dL   HCT 96.2 (L) 95.2 - 84.1 %   MCV 88.6 80.0 - 100.0 fL   MCH 30.3 26.0 - 34.0 pg   MCHC 34.2 30.0 - 36.0 g/dL   RDW 32.4 40.1 - 02.7 %   Platelets 196 150 - 400 K/uL   nRBC 0.0 0.0 - 0.2 %    Comment: Performed at [facility], [address] [place], [place] [address]  Protein / creatinine ratio, urine     Status: Abnormal   Collection Time: 07/02/21  7:47 PM  Result Value Ref Range   Creatinine, Urine 427.13 mg/dL   Total Protein, Urine 366 mg/dL   Protein Creatinine Ratio 0.86 (H) 0.00 - 0.15 mg/mg[Cre]    Comment: Performed at [facility], [address], [place], [place] [address]    I have reviewed the patient's current medications.  ASSESSMENT: Active Problems:   Supervision of high-risk pregnancy, third trimester   Preeclampsia   [redacted] weeks gestation of pregnancy   Preeclampsia, third trimester   PLAN: Pt will be transferred to L and D for delivery due to fetal testing.   Pt made aware that due to NST and BPP findings the baby may not tolerate labor and may need delivery by cesarean section.    Mariel Aloe, MD [facility] Faculty Attending, Center for [facility] 07/04/2021 10:51 AM

## 2021-07-04 NOTE — H&P (Addendum)
OBSTETRIC ADMISSION HISTORY AND PHYSICAL  Transfer from Ante for induction due to BPP 4/10 (Poor movement, tone, non-reactive NST) and now qualifying for pre-eclampsia with severe features due to BP> 160/110 requiring IV anti-hypertensives and elevated Cr (Cr 1.1 today).   Katrina Cain is a 25 y.o. female G1P0000 with IUP at [redacted]w[redacted]d by 9wk [place] presenting for IOL for Pre-E with severe features and a BPP 4/8. She reports +FMs, No LOF, no VB, no blurry vision, headaches or peripheral edema, and RUQ pain.  She plans on breast feeding. She is undecided regarding birth control. She [facility] at  [facility].    Dating: By Gaetana Michaelis [place] --->  Estimated Date of Delivery: 08/14/21  Sono:    @[redacted]w[redacted]d , CWD, normal anatomy, cephalic presentation, 2050g, 10-02-2002 EFW   Prenatal History/Complications: Pre-Eclampsia  Past Medical History: Past Medical History:  Diagnosis Date   Asthma    GERD (gastroesophageal reflux disease)    Vaginal Pap smear, abnormal     Past Surgical History: Past Surgical History:  Procedure Laterality Date   NO PAST SURGERIES      Obstetrical History: OB History     Gravida  1   Para  0   Term  0   Preterm  0   AB  0   Living  0      SAB  0   IAB  0   Ectopic  0   Multiple  0   Live Births  0           Social History Social History   Socioeconomic History   Marital status: Single    Spouse name: Not on file   Number of children: Not on file   Years of education: Not on file   Highest education level: Not on file  Occupational History   Not on file  Tobacco Use   Smoking status: Former    Types: Cigarettes    Quit date: 11/14/2020    Years since quitting: 0.6   Smokeless tobacco: Never  Vaping Use   Vaping Use: Never used  Substance and Sexual Activity   Alcohol use: No   Drug use: Not Currently    Types: Marijuana    Comment: Stopped when pregnancy confirmed   Sexual activity: Yes    Partners: Male    Birth  control/protection: None  Other Topics Concern   Not on file  Social History Narrative   Not on file   Social Determinants of Health   Financial Resource Strain: Not on file  Food Insecurity: Not on file  Transportation Needs: Not on file  Physical Activity: Not on file  Stress: Not on file  Social Connections: Not on file    Family History: Family History  Problem Relation Age of Onset   Diabetes Mother    Hypertension Mother    Cancer Father    Cancer Paternal Grandmother     Allergies: No Known Allergies  Medications Prior to Admission  Medication Sig Dispense Refill Last Dose   cyclobenzaprine (FLEXERIL) 10 MG tablet Take 1 tablet (10 mg total) by mouth every 8 (eight) hours as needed for muscle spasms. 30 tablet 0    Doxylamine-Pyridoxine 10-10 MG TBEC Take by mouth.      famotidine (PEPCID) 20 MG tablet Take 1 tablet (20 mg total) by mouth 2 (two) times daily. 60 tablet 0    glycopyrrolate (ROBINUL) 1 MG tablet Take 1 tablet (1 mg total) by mouth 3 (three)  times daily. 60 tablet 0    metoCLOPramide (REGLAN) 10 MG tablet Take 1 tablet (10 mg total) by mouth 3 (three) times daily with meals as needed for nausea. 30 tablet 2    Prenat-Fe Carbonyl-FA-Omega 3 (ONE-A-DAY WOMENS PRENATAL 1 PO) Take by mouth.        Review of Systems   All systems reviewed and negative except as stated in HPI  Blood pressure 117/62, pulse (!) 108, temperature 98.1 F (36.7 C), temperature source Oral, resp. rate 16, height 5\' 8"  (1.727 m), weight 75.3 kg, last menstrual period 11/07/2020, SpO2 99 %. General appearance: alert, cooperative, and no distress Lungs: normal work of breathing on room air Heart: regular rate and rhythm Abdomen: soft, non-tender; gravid uterus Pelvic: Normal external female genitalia Extremities: no sign of DVT Presentation: cephalic Fetal monitoringBaseline: 140 bpm, Variability: minimal to moderate, Accelerations: none, and Decelerations: Absent Uterine  activity Irregular uterine activity on monitor, patient not feeling anything Dilation: Closed Effacement (%): Thick Station: Ballotable Exam by:: [email or web address], DO   Prenatal labs: ABO, Rh: --/--/AB POS (09/19 1730) Antibody: NEG (09/19 1730) Rubella: 5.13 (04/11 1434) RPR: Non Reactive (07/29 0850)  HBsAg: Negative (04/11 1434)  HIV: Non Reactive (07/29 0850)  GBS:   Culture collected on 9/19 and pending  1 hr Glucola WNL Genetic screening  Low Risk  Anatomy 10/19 EIF, otherwise normal  Prenatal Transfer Tool  Maternal Diabetes: No Genetic Screening: Normal Maternal Ultrasounds/Referrals: Isolated EIF (echogenic intracardiac focus) Fetal Ultrasounds or other Referrals:  None Maternal Substance Abuse:  No Significant Maternal Medications:  None Significant Maternal Lab Results: Other: GBS unknown  Results for orders placed or performed during the hospital encounter of 07/02/21 (from the past 24 hour(s))  CBC   Collection Time: 07/04/21 10:46 AM  Result Value Ref Range   WBC 10.1 4.0 - 10.5 K/uL   RBC 3.46 (L) 3.87 - 5.11 MIL/uL   Hemoglobin 10.9 (L) 12.0 - 15.0 g/dL   HCT 07/06/21 (L) 16.1 - 09.6 %   MCV 88.2 80.0 - 100.0 fL   MCH 31.5 26.0 - 34.0 pg   MCHC 35.7 30.0 - 36.0 g/dL   RDW 04.5 40.9 - 81.1 %   Platelets 200 150 - 400 K/uL   nRBC 0.0 0.0 - 0.2 %  Comprehensive metabolic panel   Collection Time: 07/04/21 10:46 AM  Result Value Ref Range   Sodium 135 135 - 145 mmol/L   Potassium 3.8 3.5 - 5.1 mmol/L   Chloride 104 98 - 111 mmol/L   CO2 18 (L) 22 - 32 mmol/L   Glucose, Bld 92 70 - 99 mg/dL   BUN 21 (H) 6 - 20 mg/dL   Creatinine, Ser 07/06/21 (H) 0.44 - 1.00 mg/dL   Calcium 8.6 (L) 8.9 - 10.3 mg/dL   Total Protein 5.8 (L) 6.5 - 8.1 g/dL   Albumin 2.7 (L) 3.5 - 5.0 g/dL   AST 25 15 - 41 U/L   ALT 22 0 - 44 U/L   Alkaline Phosphatase 176 (H) 38 - 126 U/L   Total Bilirubin 0.8 0.3 - 1.2 mg/dL   GFR, Estimated 7.82 >95 mL/min   Anion gap 13 5 - 15    Patient  Active Problem List   Diagnosis Date Noted   [redacted] weeks gestation of pregnancy 07/02/2021   Preeclampsia, third trimester 07/02/2021   Preeclampsia 06/28/2021   Preterm uterine contractions 06/28/2021   GERD (gastroesophageal reflux disease) 06/28/2021   HSIL (high grade squamous intraepithelial  lesion) on Pap smear of cervix 02/28/2021   Supervision of high-risk pregnancy, third trimester 01/17/2021    Assessment/Plan:  URIYAH MASSIMO is a 25 y.o. G1P0000 at [redacted]w[redacted]d here for IOL for Pre-E with SF and a 4/10 BPP.  #Labor:Patient starting with closed cervix. Unable to place FB due to maternal discomfort with speculum exam and poor visualization. She is willing to try again on future exams if still favorable for such. Will start induction with cytotec and monitor fetal status closely. Induction plan was discussed with Dr. Ashok Pall.  #Pain: PRN, planning epidural #FWB: Largely Cat II due to minimal variability without any decelerations, occasional Cat I, and BPP 4/10 earlier today. Recently started on Mg IV for neuroprotection which is likely contributing to tracing. Will monitor strip closely. Discussed with Dr. [name].  #ID: GBS Unknown, however culture collected on 9/19 and pending. Start PCN.  #MOF: Breast #MOC: Trying to decide between POPs and IUD  #Pre-E with SF: Meets criteria with BP + elevated Cr (1.1). Asymptomatic-No HA, RUQ pain, vision changes. Last BP 117/62, however has been averaging SBP 140-160's earlier and requiring hydralazine x3. S/p Mg Bolus, on Mag at 1g/hr due to AKI. Recheck labs at 1800.   #Pre-term IOL: [redacted]w[redacted]d today and first child. Will consult Neonatology to discuss expectations with a pre-term infant.   Dorothyann Gibbs, MD  07/04/2021, 3:45 PM  GME ATTESTATION:  I saw and evaluated the patient. I agree with the findings and the plan of care as documented in the resident's note. My edits were added.   Leticia Penna, DO OB Fellow, Faculty [facility], Center  for [facility] 07/04/2021 6:37 PM

## 2021-07-05 ENCOUNTER — Encounter (HOSPITAL_COMMUNITY): Payer: Self-pay | Admitting: Obstetrics and Gynecology

## 2021-07-05 ENCOUNTER — Inpatient Hospital Stay (HOSPITAL_COMMUNITY): Payer: Medicaid Other | Admitting: Anesthesiology

## 2021-07-05 ENCOUNTER — Encounter (HOSPITAL_COMMUNITY)
Admission: AD | Disposition: A | Discharge status: Home / Self Care | Payer: Self-pay | Source: Home / Self Care | Attending: Obstetrics and Gynecology

## 2021-07-05 DIAGNOSIS — O9962 Diseases of the digestive system complicating childbirth: Secondary | ICD-10-CM | POA: Diagnosis not present

## 2021-07-05 DIAGNOSIS — Z3A34 34 weeks gestation of pregnancy: Secondary | ICD-10-CM | POA: Diagnosis not present

## 2021-07-05 DIAGNOSIS — Z20822 Contact with and (suspected) exposure to covid-19: Secondary | ICD-10-CM | POA: Diagnosis not present

## 2021-07-05 DIAGNOSIS — O1414 Severe pre-eclampsia complicating childbirth: Secondary | ICD-10-CM | POA: Diagnosis not present

## 2021-07-05 DIAGNOSIS — O43893 Other placental disorders, third trimester: Secondary | ICD-10-CM | POA: Diagnosis not present

## 2021-07-05 DIAGNOSIS — Z87891 Personal history of nicotine dependence: Secondary | ICD-10-CM | POA: Diagnosis not present

## 2021-07-05 DIAGNOSIS — Z3A33 33 weeks gestation of pregnancy: Secondary | ICD-10-CM | POA: Diagnosis not present

## 2021-07-05 DIAGNOSIS — K219 Gastro-esophageal reflux disease without esophagitis: Secondary | ICD-10-CM | POA: Diagnosis not present

## 2021-07-05 DIAGNOSIS — O43813 Placental infarction, third trimester: Secondary | ICD-10-CM | POA: Diagnosis not present

## 2021-07-05 LAB — COMPREHENSIVE METABOLIC PANEL
ALT: 25 U/L (ref 0–44)
ALT: 35 U/L (ref 0–44)
AST: 28 U/L (ref 15–41)
AST: 43 U/L — ABNORMAL HIGH (ref 15–41)
Albumin: 2.8 g/dL — ABNORMAL LOW (ref 3.5–5.0)
Albumin: 2.7 g/dL — ABNORMAL LOW (ref 3.5–5.0)
Alkaline Phosphatase: 168 U/L — ABNORMAL HIGH (ref 38–126)
Alkaline Phosphatase: 166 U/L — ABNORMAL HIGH (ref 38–126)
Anion gap: 13 (ref 5–15)
Anion gap: 11 (ref 5–15)
BUN: 17 mg/dL (ref 6–20)
BUN: 14 mg/dL (ref 6–20)
CO2: 18 mmol/L — ABNORMAL LOW (ref 22–32)
CO2: 20 mmol/L — ABNORMAL LOW (ref 22–32)
Calcium: 7.8 mg/dL — ABNORMAL LOW (ref 8.9–10.3)
Calcium: 7.1 mg/dL — ABNORMAL LOW (ref 8.9–10.3)
Chloride: 104 mmol/L (ref 98–111)
Chloride: 104 mmol/L (ref 98–111)
Creatinine, Ser: 1.04 mg/dL — ABNORMAL HIGH (ref 0.44–1.00)
Creatinine, Ser: 1.05 mg/dL — ABNORMAL HIGH (ref 0.44–1.00)
GFR, Estimated: >60 mL/min (ref >60)
GFR, Estimated: >60 mL/min (ref >60)
Glucose, Bld: 93 mg/dL (ref 70–99)
Glucose, Bld: 114 mg/dL — ABNORMAL HIGH (ref 70–99)
Potassium: 3.8 mmol/L (ref 3.5–5.1)
Potassium: 3.7 mmol/L (ref 3.5–5.1)
Sodium: 135 mmol/L (ref 135–145)
Sodium: 135 mmol/L (ref 135–145)
Total Bilirubin: 0.7 mg/dL (ref 0.3–1.2)
Total Bilirubin: 0.8 mg/dL (ref 0.3–1.2)
Total Protein: 5.7 g/dL — ABNORMAL LOW (ref 6.5–8.1)
Total Protein: 5.5 g/dL — ABNORMAL LOW (ref 6.5–8.1)

## 2021-07-05 LAB — CBC
HCT: 30 % — ABNORMAL LOW (ref 36.0–46.0)
HCT: 29 % — ABNORMAL LOW (ref 36.0–46.0)
Hemoglobin: 10.6 g/dL — ABNORMAL LOW (ref 12.0–15.0)
Hemoglobin: 9.8 g/dL — ABNORMAL LOW (ref 12.0–15.0)
MCH: 31.1 pg (ref 26.0–34.0)
MCH: 30.2 pg (ref 26.0–34.0)
MCHC: 35.3 g/dL (ref 30.0–36.0)
MCHC: 33.8 g/dL (ref 30.0–36.0)
MCV: 88 fL (ref 80.0–100.0)
MCV: 89.2 fL (ref 80.0–100.0)
Platelets: 223 10*3/uL (ref 150–400)
Platelets: 185 10*3/uL (ref 150–400)
RBC: 3.41 MIL/uL — ABNORMAL LOW (ref 3.87–5.11)
RBC: 3.25 MIL/uL — ABNORMAL LOW (ref 3.87–5.11)
RDW: 13.9 % (ref 11.5–15.5)
RDW: 13.8 % (ref 11.5–15.5)
WBC: 13.4 10*3/uL — ABNORMAL HIGH (ref 4.0–10.5)
WBC: 16.8 10*3/uL — ABNORMAL HIGH (ref 4.0–10.5)
nRBC: 0 % (ref 0.0–0.2)
nRBC: 0.1 % (ref 0.0–0.2)

## 2021-07-05 LAB — MAGNESIUM
Magnesium: 5.1 mg/dL — ABNORMAL HIGH (ref 1.7–2.4)
Magnesium: 6.2 mg/dL (ref 1.7–2.4)

## 2021-07-05 LAB — RPR: RPR Ser Ql: NONREACTIVE

## 2021-07-05 SURGERY — Surgical Case
Anesthesia: Epidural | Wound class: Clean Contaminated

## 2021-07-05 MED ORDER — SIMETHICONE 80 MG PO CHEW: 80.0000 mg | CHEWABLE_TABLET | ORAL | Status: DC | PRN

## 2021-07-05 MED ORDER — DEXAMETHASONE SODIUM PHOSPHATE 4 MG/ML IJ SOLN
INTRAMUSCULAR | Status: DC | PRN
  Administered 2021-07-05: 8 mg via INTRAVENOUS

## 2021-07-05 MED ORDER — SODIUM CHLORIDE 0.9% FLUSH: 3.0000 mL | INTRAVENOUS | Status: DC | PRN

## 2021-07-05 MED ORDER — OXYTOCIN-SODIUM CHLORIDE 30-0.9 UT/500ML-% IV SOLN: 2.5000 [IU]/h | INTRAVENOUS | Status: AC

## 2021-07-05 MED ORDER — FENTANYL-BUPIVACAINE-NACL 0.5-0.125-0.9 MG/250ML-% EP SOLN
12.0000 mL/h | EPIDURAL | Status: DC | PRN
  Administered 2021-07-05: 12 mL/h via EPIDURAL
  Filled 2021-07-05: qty 250

## 2021-07-05 MED ORDER — SODIUM CHLORIDE 0.9 % IV SOLN: 500.0000 mg | INTRAVENOUS | Status: DC

## 2021-07-05 MED ORDER — SODIUM BICARBONATE 8.4 % IV SOLN
INTRAVENOUS | Status: DC | PRN
  Administered 2021-07-05 (×4): 5 mL via EPIDURAL

## 2021-07-05 MED ORDER — NALOXONE HCL 0.4 MG/ML IJ SOLN: 0.4000 mg | INTRAMUSCULAR | Status: DC | PRN

## 2021-07-05 MED ORDER — KETOROLAC TROMETHAMINE 30 MG/ML IJ SOLN
30.0000 mg | Freq: Four times a day (QID) | INTRAMUSCULAR | Status: AC | PRN
  Administered 2021-07-05: 30 mg via INTRAVENOUS

## 2021-07-05 MED ORDER — ACETAMINOPHEN 160 MG/5ML PO SOLN: 325.0000 mg | ORAL | Status: DC | PRN

## 2021-07-05 MED ORDER — DIPHENHYDRAMINE HCL 50 MG/ML IJ SOLN: 12.5000 mg | INTRAMUSCULAR | Status: DC | PRN

## 2021-07-05 MED ORDER — NALOXONE HCL 4 MG/10ML IJ SOLN
1.0000 ug/kg/h | INTRAVENOUS | Status: DC | PRN
  Filled 2021-07-05: qty 5

## 2021-07-05 MED ORDER — LACTATED RINGERS IV SOLN: INTRAVENOUS | Status: DC | PRN

## 2021-07-05 MED ORDER — KETOROLAC TROMETHAMINE 30 MG/ML IJ SOLN: 30.0000 mg | Freq: Four times a day (QID) | INTRAMUSCULAR | Status: AC | PRN

## 2021-07-05 MED ORDER — LIDOCAINE HCL (PF) 1 % IJ SOLN
INTRAMUSCULAR | Status: DC | PRN
  Administered 2021-07-05: 3 mL via EPIDURAL

## 2021-07-05 MED ORDER — SENNOSIDES-DOCUSATE SODIUM 8.6-50 MG PO TABS
2.0000 | ORAL_TABLET | ORAL | Status: DC
  Administered 2021-07-06 – 2021-07-07 (×2): 2 via ORAL
  Filled 2021-07-05 (×3): qty 2

## 2021-07-05 MED ORDER — ACETAMINOPHEN 500 MG PO TABS
1000.0000 mg | ORAL_TABLET | Freq: Four times a day (QID) | ORAL | Status: DC
  Administered 2021-07-06 – 2021-07-08 (×10): 1000 mg via ORAL
  Filled 2021-07-05 (×12): qty 2

## 2021-07-05 MED ORDER — ACETAMINOPHEN 10 MG/ML IV SOLN
1000.0000 mg | Freq: Once | INTRAVENOUS | Status: DC | PRN
  Administered 2021-07-05: 1000 mg via INTRAVENOUS

## 2021-07-05 MED ORDER — STERILE WATER FOR IRRIGATION IR SOLN
Status: DC | PRN
  Administered 2021-07-05: 1000 mL

## 2021-07-05 MED ORDER — IBUPROFEN 600 MG PO TABS
600.0000 mg | ORAL_TABLET | Freq: Four times a day (QID) | ORAL | Status: AC
  Administered 2021-07-05 – 2021-07-08 (×12): 600 mg via ORAL
  Filled 2021-07-05 (×12): qty 1

## 2021-07-05 MED ORDER — DEXMEDETOMIDINE (PRECEDEX) IN NS 20 MCG/5ML (4 MCG/ML) IV SYRINGE
PREFILLED_SYRINGE | INTRAVENOUS | Status: AC
  Filled 2021-07-05: qty 5

## 2021-07-05 MED ORDER — EPHEDRINE 5 MG/ML INJ: 10.0000 mg | INTRAVENOUS | Status: DC | PRN

## 2021-07-05 MED ORDER — OXYTOCIN-SODIUM CHLORIDE 30-0.9 UT/500ML-% IV SOLN
INTRAVENOUS | Status: DC | PRN
  Administered 2021-07-05: 200 mL via INTRAVENOUS

## 2021-07-05 MED ORDER — MORPHINE SULFATE (PF) 0.5 MG/ML IJ SOLN
INTRAMUSCULAR | Status: DC | PRN
  Administered 2021-07-05: 3 mg via EPIDURAL

## 2021-07-05 MED ORDER — DEXMEDETOMIDINE HCL IN NACL 400 MCG/100ML IV SOLN
INTRAVENOUS | Status: DC | PRN
  Administered 2021-07-05: 8 ug via INTRAVENOUS

## 2021-07-05 MED ORDER — METOCLOPRAMIDE HCL 5 MG/ML IJ SOLN
INTRAMUSCULAR | Status: AC
  Filled 2021-07-05: qty 2

## 2021-07-05 MED ORDER — KETOROLAC TROMETHAMINE 30 MG/ML IJ SOLN
INTRAMUSCULAR | Status: AC
  Filled 2021-07-05: qty 1

## 2021-07-05 MED ORDER — SODIUM CHLORIDE 0.9 % IR SOLN
Status: DC | PRN
  Administered 2021-07-05: 1000 mL

## 2021-07-05 MED ORDER — ONDANSETRON HCL 4 MG/2ML IJ SOLN
INTRAMUSCULAR | Status: AC
  Filled 2021-07-05: qty 2

## 2021-07-05 MED ORDER — MAGNESIUM SULFATE 40 GM/1000ML IV SOLN
1.0000 g/h | INTRAVENOUS | Status: AC
  Administered 2021-07-05: 1 g/h via INTRAVENOUS
  Filled 2021-07-05: qty 1000

## 2021-07-05 MED ORDER — PRENATAL MULTIVITAMIN CH
1.0000 | ORAL_TABLET | Freq: Every day | ORAL | Status: DC
  Administered 2021-07-06 – 2021-07-08 (×3): 1 via ORAL
  Filled 2021-07-05 (×3): qty 1

## 2021-07-05 MED ORDER — FENTANYL CITRATE (PF) 100 MCG/2ML IJ SOLN
INTRAMUSCULAR | Status: AC
  Filled 2021-07-05: qty 2

## 2021-07-05 MED ORDER — PHENYLEPHRINE 40 MCG/ML (10ML) SYRINGE FOR IV PUSH (FOR BLOOD PRESSURE SUPPORT): 80.0000 ug | PREFILLED_SYRINGE | INTRAVENOUS | Status: DC | PRN

## 2021-07-05 MED ORDER — DIPHENHYDRAMINE HCL 25 MG PO CAPS: 25.0000 mg | ORAL_CAPSULE | ORAL | Status: DC | PRN

## 2021-07-05 MED ORDER — PHENYLEPHRINE HCL (PRESSORS) 10 MG/ML IV SOLN
INTRAVENOUS | Status: DC | PRN
  Administered 2021-07-05: 40 ug via INTRAVENOUS
  Administered 2021-07-05 (×3): 80 ug via INTRAVENOUS

## 2021-07-05 MED ORDER — PROMETHAZINE HCL 25 MG/ML IJ SOLN: 6.2500 mg | INTRAMUSCULAR | Status: DC | PRN

## 2021-07-05 MED ORDER — NALBUPHINE HCL 10 MG/ML IJ SOLN: 5.0000 mg | INTRAMUSCULAR | Status: DC | PRN

## 2021-07-05 MED ORDER — OXYTOCIN-SODIUM CHLORIDE 30-0.9 UT/500ML-% IV SOLN
INTRAVENOUS | Status: AC
  Filled 2021-07-05: qty 500

## 2021-07-05 MED ORDER — COCONUT OIL OIL: 1.0000 "application " | TOPICAL_OIL | Status: DC | PRN

## 2021-07-05 MED ORDER — SOD CITRATE-CITRIC ACID 500-334 MG/5ML PO SOLN: 30.0000 mL | ORAL | Status: DC

## 2021-07-05 MED ORDER — DEXAMETHASONE SODIUM PHOSPHATE 4 MG/ML IJ SOLN
INTRAMUSCULAR | Status: AC
  Filled 2021-07-05: qty 1

## 2021-07-05 MED ORDER — SODIUM BICARBONATE 8.4 % IV SOLN
INTRAVENOUS | Status: AC
  Filled 2021-07-05: qty 50

## 2021-07-05 MED ORDER — MORPHINE SULFATE (PF) 0.5 MG/ML IJ SOLN
INTRAMUSCULAR | Status: AC
  Filled 2021-07-05: qty 10

## 2021-07-05 MED ORDER — LIDOCAINE-EPINEPHRINE (PF) 1.5 %-1:200000 IJ SOLN
INTRAMUSCULAR | Status: DC | PRN
  Administered 2021-07-05: 5 mL via PERINEURAL

## 2021-07-05 MED ORDER — ONDANSETRON HCL 4 MG/2ML IJ SOLN
INTRAMUSCULAR | Status: DC | PRN
  Administered 2021-07-05: 4 mg via INTRAVENOUS

## 2021-07-05 MED ORDER — METOCLOPRAMIDE HCL 5 MG/ML IJ SOLN
INTRAMUSCULAR | Status: DC | PRN
  Administered 2021-07-05: 10 mg via INTRAVENOUS

## 2021-07-05 MED ORDER — SIMETHICONE 80 MG PO CHEW
80.0000 mg | CHEWABLE_TABLET | Freq: Three times a day (TID) | ORAL | Status: DC
  Administered 2021-07-05 – 2021-07-07 (×6): 80 mg via ORAL
  Filled 2021-07-05 (×7): qty 1

## 2021-07-05 MED ORDER — DIPHENHYDRAMINE HCL 25 MG PO CAPS: 25.0000 mg | ORAL_CAPSULE | Freq: Four times a day (QID) | ORAL | Status: DC | PRN

## 2021-07-05 MED ORDER — ONDANSETRON HCL 4 MG/2ML IJ SOLN: 4.0000 mg | Freq: Three times a day (TID) | INTRAMUSCULAR | Status: DC | PRN

## 2021-07-05 MED ORDER — OXYCODONE HCL 5 MG PO TABS: 5.0000 mg | ORAL_TABLET | ORAL | Status: DC | PRN

## 2021-07-05 MED ORDER — MEASLES, MUMPS & RUBELLA VAC IJ SOLR: 0.5000 mL | Freq: Once | INTRAMUSCULAR | Status: DC

## 2021-07-05 MED ORDER — NALBUPHINE HCL 10 MG/ML IJ SOLN: 5.0000 mg | Freq: Once | INTRAMUSCULAR | Status: DC | PRN

## 2021-07-05 MED ORDER — ENOXAPARIN SODIUM 40 MG/0.4ML IJ SOSY
40.0000 mg | PREFILLED_SYRINGE | INTRAMUSCULAR | Status: DC
  Administered 2021-07-06 – 2021-07-07 (×2): 40 mg via SUBCUTANEOUS
  Filled 2021-07-05 (×2): qty 0.4

## 2021-07-05 MED ORDER — FENTANYL CITRATE (PF) 100 MCG/2ML IJ SOLN: 25.0000 ug | INTRAMUSCULAR | Status: DC | PRN

## 2021-07-05 MED ORDER — LACTATED RINGERS IV SOLN: 500.0000 mL | Freq: Once | INTRAVENOUS | Status: DC

## 2021-07-05 MED ORDER — ZOLPIDEM TARTRATE 5 MG PO TABS: 5.0000 mg | ORAL_TABLET | Freq: Every evening | ORAL | Status: DC | PRN

## 2021-07-05 MED ORDER — MENTHOL 3 MG MT LOZG: 1.0000 | LOZENGE | OROMUCOSAL | Status: DC | PRN

## 2021-07-05 MED ORDER — CHLOROPROCAINE HCL (PF) 3 % IJ SOLN
INTRAMUSCULAR | Status: DC | PRN
  Administered 2021-07-05: 20 mg

## 2021-07-05 MED ORDER — LACTATED RINGERS IV SOLN: INTRAVENOUS | Status: DC

## 2021-07-05 MED ORDER — CEFAZOLIN SODIUM-DEXTROSE 2-4 GM/100ML-% IV SOLN: 2.0000 g | INTRAVENOUS | Status: DC

## 2021-07-05 MED ORDER — TRANEXAMIC ACID-NACL 1000-0.7 MG/100ML-% IV SOLN
INTRAVENOUS | Status: DC | PRN
  Administered 2021-07-05: 1000 mg via INTRAVENOUS

## 2021-07-05 MED ORDER — ACETAMINOPHEN 325 MG PO TABS: 325.0000 mg | ORAL_TABLET | ORAL | Status: DC | PRN

## 2021-07-05 MED ORDER — MEPERIDINE HCL 25 MG/ML IJ SOLN: 6.2500 mg | INTRAMUSCULAR | Status: DC | PRN

## 2021-07-05 MED ORDER — SCOPOLAMINE 1 MG/3DAYS TD PT72: 1.0000 | MEDICATED_PATCH | Freq: Once | TRANSDERMAL | Status: DC

## 2021-07-05 MED ORDER — WITCH HAZEL-GLYCERIN EX PADS: 1.0000 "application " | MEDICATED_PAD | CUTANEOUS | Status: DC | PRN

## 2021-07-05 MED ORDER — CHLOROPROCAINE HCL (PF) 3 % IJ SOLN
INTRAMUSCULAR | Status: AC
  Filled 2021-07-05: qty 20

## 2021-07-05 MED ORDER — FENTANYL CITRATE (PF) 100 MCG/2ML IJ SOLN
INTRAMUSCULAR | Status: DC | PRN
  Administered 2021-07-05 (×2): 100 ug via EPIDURAL

## 2021-07-05 MED ORDER — ACETAMINOPHEN 10 MG/ML IV SOLN
INTRAVENOUS | Status: AC
  Filled 2021-07-05: qty 100

## 2021-07-05 MED ORDER — DIBUCAINE (PERIANAL) 1 % EX OINT: 1.0000 "application " | TOPICAL_OINTMENT | CUTANEOUS | Status: DC | PRN

## 2021-07-05 MED ORDER — CEFAZOLIN SODIUM-DEXTROSE 2-3 GM-%(50ML) IV SOLR
INTRAVENOUS | Status: DC | PRN
  Administered 2021-07-05: 2 g via INTRAVENOUS

## 2021-07-05 SURGICAL SUPPLY — 35 items
BENZOIN TINCTURE PRP APPL 2/3 (GAUZE/BANDAGES/DRESSINGS) ×2 IMPLANT
CHLORAPREP W/TINT 26ML (MISCELLANEOUS) ×2 IMPLANT
CLAMP CORD UMBIL (MISCELLANEOUS) IMPLANT
CLOSURE STERI STRIP 1/2 X4 (GAUZE/BANDAGES/DRESSINGS) ×2 IMPLANT
CLOTH BEACON ORANGE TIMEOUT ST (SAFETY) ×2 IMPLANT
DRSG OPSITE POSTOP 4X10 (GAUZE/BANDAGES/DRESSINGS) ×2 IMPLANT
ELECT REM PT RETURN 9FT ADLT (ELECTROSURGICAL) ×2
ELECTRODE REM PT RTRN 9FT ADLT (ELECTROSURGICAL) ×1 IMPLANT
EXTRACTOR VACUUM M CUP 4 TUBE (SUCTIONS) IMPLANT
GAUZE SPONGE 4X4 12PLY STRL LF (GAUZE/BANDAGES/DRESSINGS) ×4 IMPLANT
GLOVE BIOGEL PI IND STRL 7.0 (GLOVE) ×2 IMPLANT
GLOVE BIOGEL PI IND STRL 7.5 (GLOVE) ×2 IMPLANT
GLOVE BIOGEL PI INDICATOR 7.0 (GLOVE) ×2
GLOVE BIOGEL PI INDICATOR 7.5 (GLOVE) ×2
GLOVE ECLIPSE 7.5 STRL STRAW (GLOVE) ×2 IMPLANT
GOWN STRL REUS W/TWL LRG LVL3 (GOWN DISPOSABLE) ×6 IMPLANT
KIT ABG SYR 3ML LUER SLIP (SYRINGE) IMPLANT
NEEDLE HYPO 25X5/8 SAFETYGLIDE (NEEDLE) IMPLANT
NS IRRIG 1000ML POUR BTL (IV SOLUTION) ×2 IMPLANT
PACK C SECTION WH (CUSTOM PROCEDURE TRAY) ×2 IMPLANT
PAD ABD 7.5X8 STRL (GAUZE/BANDAGES/DRESSINGS) ×2 IMPLANT
PAD OB MATERNITY 4.3X12.25 (PERSONAL CARE ITEMS) ×2 IMPLANT
PENCIL SMOKE EVAC W/HOLSTER (ELECTROSURGICAL) ×2 IMPLANT
RETAINER VISCERAL (MISCELLANEOUS) ×2 IMPLANT
RTRCTR C-SECT PINK 25CM LRG (MISCELLANEOUS) ×2 IMPLANT
STRIP CLOSURE SKIN 1/2X4 (GAUZE/BANDAGES/DRESSINGS) ×2 IMPLANT
SUT VIC AB 0 CT1 36 (SUTURE) ×2 IMPLANT
SUT VIC AB 0 CTX 36 (SUTURE) ×2
SUT VIC AB 0 CTX36XBRD ANBCTRL (SUTURE) ×2 IMPLANT
SUT VIC AB 2-0 CT1 27 (SUTURE) ×1
SUT VIC AB 2-0 CT1 TAPERPNT 27 (SUTURE) ×1 IMPLANT
SUT VIC AB 4-0 KS 27 (SUTURE) ×2 IMPLANT
TOWEL OR 17X24 6PK STRL BLUE (TOWEL DISPOSABLE) ×2 IMPLANT
TRAY FOLEY W/BAG SLVR 14FR LF (SET/KITS/TRAYS/PACK) ×2 IMPLANT
WATER STERILE IRR 1000ML POUR (IV SOLUTION) ×2 IMPLANT

## 2021-07-05 NOTE — Anesthesia Procedure Notes (Signed)
Epidural Patient location during procedure: OB Start time: 07/05/2021 4:53 AM End time: 07/05/2021 5:12 AM  Staffing Anesthesiologist: Lucretia Kern, MD Performed: anesthesiologist   Preanesthetic Checklist Completed: patient identified, IV checked, risks and benefits discussed, monitors and equipment checked, pre-op evaluation and timeout performed  Epidural Patient position: sitting Prep: DuraPrep Patient monitoring: heart rate, continuous pulse ox and blood pressure Approach: midline Location: L3-L4 Injection technique: LOR air  Needle:  Needle type: Tuohy  Needle gauge: 17 G Needle length: 9 cm Needle insertion depth: 6 cm Catheter type: closed end flexible Catheter size: 19 Gauge Catheter at skin depth: 11 cm Test dose: negative and 1.5% lidocaine with Epi 1:200 K  Assessment Events: blood not aspirated, injection not painful, no injection resistance, no paresthesia and negative IV test  Additional Notes Reason for block:procedure for pain

## 2021-07-05 NOTE — Progress Notes (Signed)
Patient is a 25 yo G1 @ 34+2, here for preE w/ severe features, induction started yesterday for 4/10 bpp. Is on magnesium. Minimal variability overnight, no decels. Mag level therapeutic. S/p AROM at 08:30 this morning, MVUs not adequate. After lengthy discussion with colleagues, and after lengthy discussion with patient about risks and benefits of continued augmentation with re-starting pitocin versus proceeding with pltcs, patient opts to proceed with c/s.  The risks of cesarean section were discussed with the patient including but were not limited to: bleeding which may require transfusion or reoperation; infection which may require antibiotics; injury to bowel, bladder, ureters or other surrounding organs; injury to the fetus; need for additional procedures including hysterectomy in the event of a life-threatening hemorrhage; placental abnormalities wth subsequent pregnancies, incisional problems, thromboembolic phenomenon and other postoperative/anesthesia complications.  Anesthesia and OR aware.  Preoperative prophylactic antibiotics and SCDs ordered on call to the OR.  To OR when ready.

## 2021-07-05 NOTE — Progress Notes (Signed)
Katrina Cain is a 25 y.o. G1P0000 at [redacted]w[redacted]d admitted for induction of labor due to severe preeclampsia.  Subjective: Patient reports feeling ok. Denies headaches or vision changes at this time.  Objective: BP (!) 139/93   Pulse 99   Temp 99.1 F (37.3 C) (Oral)   Resp 16   Ht 5\' 8"  (1.727 m)   Wt 75.3 kg   LMP 11/07/2020 (Exact Date)   SpO2 100%   BMI 25.24 kg/m  I/O last 3 completed shifts: In: 2704.8 [P.O.:430; I.V.:2024.8; IV Piggyback:250] Out: 815 [Urine:815] Total I/O In: 250 [I.V.:250] Out: 100 [Urine:100]  FHT:  FHR: 130 bpm, variability: minimal ,  accelerations:  Abscent,  decelerations:  Absent UC:   regular, every 5-6 minutes SVE:   Dilation: 6 Effacement (%): 50 Station: -2 Exam by:: Dr. [email or web address]  Labs: Lab Results  Component Value Date   WBC 13.4 (H) 07/05/2021   HGB 10.6 (L) 07/05/2021   HCT 30.0 (L) 07/05/2021   MCV 88.0 07/05/2021   PLT 223 07/05/2021    Assessment / Plan: Induction of labor due to severe pre-eclampsia,  Cat II tracing with minimal variability without decelerations in setting of Mg. AROMed at last check  Labor:  Patient's cervix made progress from 5cm to 6 cm (with two different examiners) and head with some descent, AROMed during most recent check. Patient's tracing Cat II for minimal variability without decelerations. Discussed with L&D attending Dr. 07/07/2021 and patient and RN team regarding tracing and delivery plan. At this time no decelerations however continues to have minimal variability and no accels. Patient on magnesium at this time. Discussed with patient and family regarding at this time there is no immediate recommendation of emergent c-section however offered option of continuing to labor and trialing pitocin or cesarean delivery. Patient and family would like to discuss amongst themselves.  - check in with patient after family discusses.  Preeclampsia:  on magnesium sulfate. BP in 130s/60s-90s. Cr stable/mild improvement  1.13>1.04. Continue on Mg and monitor UOP Fetal Wellbeing:  Category II Pain Control:  Epidural I/D:   GBS negative Anticipated MOD:   Continuing to monitor tracing and discussing with patient regarding Cat II tracing due to variability concerns.   Ashok Pall 07/05/2021, 9:13 AM

## 2021-07-05 NOTE — Transfer of Care (Signed)
Immediate Anesthesia Transfer of Care Note  Patient: Katrina Cain  Procedure(s) Performed: CESAREAN SECTION  Patient Location: PACU  Anesthesia Type:Epidural  Level of Consciousness: awake, alert  and oriented  Airway & Oxygen Therapy: Patient Spontanous Breathing  Post-op Assessment: Report given to RN and Post -op Vital signs reviewed and stable  Post vital signs: Reviewed and stable  Last Vitals:  Vitals Value Taken Time  BP 139/92 07/05/21 1300  Temp 36.8 C 07/05/21 1250  Pulse 88 07/05/21 1302  Resp 18 07/05/21 1302  SpO2 100 % 07/05/21 1302  Vitals shown include unvalidated device data.  Last Pain:  Vitals:   07/05/21 1150  TempSrc: Axillary  PainSc:       Patients Stated Pain Goal: 3 (07/02/21 2100)  Complications: No notable events documented.

## 2021-07-05 NOTE — Anesthesia Preprocedure Evaluation (Signed)
Anesthesia Evaluation  Patient identified by MRN, date of birth, ID band Patient awake    Reviewed: Allergy & Precautions, H&P , NPO status , Patient's Chart, lab work & pertinent test results  History of Anesthesia Complications Negative for: history of anesthetic complications  Airway Mallampati: II  TM Distance: >3 FB     Dental   Pulmonary asthma , former smoker,    Pulmonary exam normal        Cardiovascular hypertension (pre-eclampsia),  Rhythm:regular Rate:Normal     Neuro/Psych negative neurological ROS  negative psych ROS   GI/Hepatic Neg liver ROS, GERD  ,  Endo/Other  negative endocrine ROS  Renal/GU negative Renal ROS  negative genitourinary   Musculoskeletal   Abdominal   Peds  Hematology  (+) Blood dyscrasia, anemia ,   Anesthesia Other Findings   Reproductive/Obstetrics (+) Pregnancy                             Anesthesia Physical Anesthesia Plan  ASA: 3  Anesthesia Plan: Epidural   Post-op Pain Management:    Induction:   PONV Risk Score and Plan:   Airway Management Planned:   Additional Equipment:   Intra-op Plan:   Post-operative Plan:   Informed Consent: I have reviewed the patients History and Physical, chart, labs and discussed the procedure including the risks, benefits and alternatives for the proposed anesthesia with the patient or authorized representative who has indicated his/her understanding and acceptance.       Plan Discussed with:   Anesthesia Plan Comments:         Anesthesia Quick Evaluation

## 2021-07-05 NOTE — Progress Notes (Signed)
Pt does not wish to pump at this time.

## 2021-07-05 NOTE — Progress Notes (Signed)
Labor Progress Note Katrina Cain is a 25 y.o. G1P0000 at [redacted]w[redacted]d who presented for IOL due to severe pre-E and BPP 4/10.   S: Patient is resting. Comfortable with epidural. No concerns at this time.  O:  BP 124/67   Pulse 93   Temp 98.5 F (36.9 C) (Oral)   Resp 16   Ht 5\' 8"  (1.727 m)   Wt 75.3 kg   LMP 11/07/2020 (Exact Date)   SpO2 100%   BMI 25.24 kg/m   EFM: Baseline 125 bpm, absent variability, no accels, no decels   CVE: Dilation: 5 Effacement (%): 80 Station: -2 Presentation: Vertex Exam by:: Dr. [email or web address]   A&P: 25 y.o. G1P0000 [redacted]w[redacted]d   #Labor: Pitocin discontinued due to absent variability on FHT. Will plan for 2 hour Pitocin break and reassess. Discussed with patient that fetal status likely affected by magnesium as well. Advised that if FHT continues to show absent variability, may need to proceed with cesarean section if remote from vaginal delivery. Planning to discuss with oncoming team this AM. Management discussed with Dr. 12-03-2002 who is in agreement with plan.  #Pain: Epidural  #FWB: Cat 3 tracing due to absent variability. Pitocin discontinued. No decels. Closely monitoring. #GBS negative  #Pre-E with SF: Asymptomatic. BP normal to mild range. Repeat AM labs stable. Creatinine 1.04; down from 1.13. Urine output decreased per RN. Will continue IVF and monitor closely.   [name], MD 7:51 AM

## 2021-07-05 NOTE — Progress Notes (Signed)
Labor Progress Note Katrina Cain is a 25 y.o. G1P0000 at [redacted]w[redacted]d who presented for IOL due to severe pre-E and BPP 4/10.  S: Doing well. Breathing through painful contractions. No other concerns at this time.   O:  BP 138/84 (BP Location: Left Arm)   Pulse 96   Temp 98.5 F (36.9 C) (Oral)   Resp 17   Ht 5\' 8"  (1.727 m)   Wt 75.3 kg   LMP 11/07/2020 (Exact Date)   SpO2 99%   BMI 25.24 kg/m   EFM: Baseline 130 bpm, minimal variability, no accels, no decels  CVE: Dilation: 5 Effacement (%): 80 Station: -2 Presentation: Vertex Exam by:: Dr. [email or web address]   A&P: 25 y.o. G1P0000 [redacted]w[redacted]d   #Labor: Progressing well. FB out and SVE now 5/80/-2. Will continue to up titrate Pitocin as tolerated. Plan to reassess in 3-4 hours. #Pain: Requesting epidural; will notify anesthesia #FWB: Cat 2 due to minimal variability. Tolerating contractions well thus far. Will continue to monitor given no significant change in FHT. Plan of care previously discussed with Dr. 11-26-2004 who is in agreement with management.  #GBS negative  #Pre-E with SF: Remains asymptomatic. BP normal to mild range. Recent labs stable. Will continue to monitor. Plan for repeat labs later this AM.  Katrina Hidden, MD 3:56 AM

## 2021-07-05 NOTE — Anesthesia Postprocedure Evaluation (Signed)
Anesthesia Post Note  Patient: Katrina Cain  Procedure(s) Performed: CESAREAN SECTION     Patient location during evaluation: PACU Anesthesia Type: Epidural Level of consciousness: oriented and awake and alert Pain management: pain level controlled Vital Signs Assessment: post-procedure vital signs reviewed and stable Respiratory status: spontaneous breathing, respiratory function stable and patient connected to nasal cannula oxygen Cardiovascular status: blood pressure returned to baseline and stable Postop Assessment: no headache, no backache, no apparent nausea or vomiting and epidural receding Anesthetic complications: no   No notable events documented.  Last Vitals:  Vitals:   07/05/21 1411 07/05/21 1507  BP: (!) 148/95 (!) 141/83  Pulse: 87 90  Resp: 18   Temp: 37.3 C   SpO2: 99%     Last Pain:  Vitals:   07/05/21 1411  TempSrc: Oral  PainSc:    Pain Goal: Patients Stated Pain Goal: 2 (07/04/21 2302)                 Shelton Silvas

## 2021-07-05 NOTE — Op Note (Signed)
Cesarean Section Operative Report  VONDA HARTH  07/05/2021  Indications:  Fetal intolerance of labor, remote from delivery    Pre-operative Diagnosis: Unscheduled Cesarean Section, See Delivery Summary fetal heart rate indication, pre-eclampsia with severe features  Post-operative Diagnosis: Same   Surgeon: Surgeon(s) and Role:    * Wouk, Wilfred Curtis, MD - Primary    * Warner Mccreedy, MD - Assisting     Anesthesia: epidural    Estimated Blood Loss: 224 ml  Total IV Fluids: 1000 ml LR  Urine Output:: 100 ml clear urine  Specimens: None  Findings: Viable female infant in vertex presentation; Apgars 9,9; weight pending; arterial cord pH 7.29;  clear amniotic fluid; intact placenta with three vessel cord; normal uterus, fallopian tubes and ovaries bilaterally.  Baby condition / location:  NICU   Complications: no complications  Indications: EARLE BURSON is a 25 y.o. G1P0101 with an IUP [redacted]w[redacted]d presenting with pre-eclampsia with severe features with persistent Cat II tracing for minimal variability without accels and remote from delivery.  The risks, benefits, complications, treatment options, and exected outcomes were discussed with the patient . The patient dwith the proposed plan, giving informed consent. identified as [place] and the procedure verified as C-Section Delivery.  Procedure Details:  The patient was taken back to the operative suite (she already had epidural anesthesia in place)  A time out was held and the above information confirmed.   After induction of anesthesia, the patient was draped and prepped in the usual sterile manner and placed in a dorsal supine position with a leftward tilt. A Pfannenstiel incision was made and carried down through the subcutaneous tissue to the fascia. Fascial incision was made and sharply extended transversely. The fascia was separated from the underlying rectus tissue superiorly and inferiorly. The peritoneum was  identified and sharply entered and extended longitudinally. Alexis retractor was placed. A low transverse uterine incision was made and extended bluntly. Delivered from cephalic presentation was a viable infant with Apgars and weight as above.  The umbilical cord was clamped and cut cord blood was obtained for evaluation. Cord ph was sent. The placenta was removed Intact and appeared normal. The uterine outline, tubes and ovaries appeared normal. The uterine incision was closed with running locked sutures of 0Vicryl with an imbricating layer of the same. Two figure of eight stitches were placed at areas with residual bleeding and excellent hemostasis was observed. The patient had pain at this time and some Chloroprocaine was poured into the cavity for local anesthesia prior to the peritoneum being closed with 2-0 Vicryl.  The rectus muscles were examined and hemostasis observed. The fascia was then reapproximated with running sutures of 0Vicryl. There was no subcutaneous suture placed. The skin was closed with 4-0Vicryl.   Instrument, sponge, and needle counts were correct prior the abdominal closure and were correct at the conclusion of the case.     Disposition: PACU - hemodynamically stable. And then [facility] for Mg for 24 hrs  Maternal Condition: stable       Signed: Warner Mccreedy, MD, MPH OB Fellow, Faculty Practice

## 2021-07-05 NOTE — Discharge Summary (Signed)
Postpartum Discharge Summary     Patient Name: Katrina Cain DOB: [birth year] MRN: [id number]  Date of admission: 07/02/2021 Delivery date:07/05/2021  Delivering provider: Laurey Arrow BEDFORD  Date of discharge: 07/08/2021  Admitting diagnosis: Pregnancy at 33/6. Mild pre-eclampsia Secondary diagnosis: Headache and concern for severe features Additional problems: HSIL pap    Discharge diagnosis: Term Pregnancy Delivered and Preeclampsia (severe)                                              Post partum procedures: None Augmentation: AROM, Pitocin, Cytotec, and IP Foley Complications: None  Hospital course: Patient admitted for observation and steroid course but then developed BPP of 4/8 on 9/21 so was moved to L&D for IOL.  She had a labor course significant for Cat II  tracing for minimal variability in setting of magnesium while being remote from delivery. Patient's pitocin was turned off due to concern for minimal variability with no decelerations and upon discussion with patient, L&D providers, and RN patient was given options of trying to start pitocin to determine if fetus would tolerate labor or a cesarean section and patient opted for cesarean section. The patient went for cesarean section due to Non-Reassuring FHR. Delivery details are as follows: Membrane Rupture Time/Date: 8:37 AM ,07/05/2021   Delivery Method:C-Section, Low Transverse  Details of operation can be found in separate operative Note.  Patient had an uncomplicated postpartum course. She is ambulating, tolerating a regular diet, passing flatus, and urinating well.  Patient is discharged home in stable condition on 07/08/21.      Newborn Data: Birth date:07/05/2021  Birth time:11:39 AM  Gender:Female  Living status:Living  Apgars:9 ,9  Weight:1800 g                                Magnesium Sulfate received: Yes: Seizure prophylaxis BMZ received: Yes Rhophylac:N/A MMR:N/A T-DaP:Given prenatally  Physical  exam  Vitals:   07/07/21 1643 07/07/21 1945 07/07/21 2346 07/08/21 0331  BP: 132/80 137/80 (!) 140/92 (!) 140/93  Pulse:  (!) 101 86 88  Resp:  18 18 18   Temp:  99.1 F (37.3 C) 98.5 F (36.9 C) 99 F (37.2 C)  TempSrc:  Oral Oral Oral  SpO2:  99% 100% 99%  Weight:      Height:       General: alert Lochia: appropriate Uterine Fundus: nttp Incision: c/d/I honeycomb dressing  Labs: Lab Results  Component Value Date   WBC 15.0 (H) 07/07/2021   HGB 9.5 (L) 07/07/2021   HCT 27.3 (L) 07/07/2021   MCV 89.2 07/07/2021   PLT 135 (L) 07/07/2021   CMP Latest Ref Rng & Units 07/07/2021  Glucose 70 - 99 mg/dL 79  BUN 6 - 20 mg/dL 11  Creatinine 0.44 - 1.00 mg/dL 0.89  Sodium 135 - 145 mmol/L 134(L)  Potassium 3.5 - 5.1 mmol/L 3.5  Chloride 98 - 111 mmol/L 101  CO2 22 - 32 mmol/L 28  Calcium 8.9 - 10.3 mg/dL 8.1(L)  Total Protein 6.5 - 8.1 g/dL 4.9(L)  Total Bilirubin 0.3 - 1.2 mg/dL 0.6  Alkaline Phos 38 - 126 U/L 122  AST 15 - 41 U/L 30  ALT 0 - 44 U/L 27   Edinburgh Score: No flowsheet data found.   After visit  meds:  Allergies as of 07/08/2021   No Known Allergies      Medication List     STOP taking these medications    cyclobenzaprine 10 MG tablet Commonly known as: FLEXERIL   Doxylamine-Pyridoxine 10-10 MG Tbec   glycopyrrolate 1 MG tablet Commonly known as: Robinul       TAKE these medications    acetaminophen 500 MG tablet Commonly known as: TYLENOL Take 2 tablets (1,000 mg total) by mouth every 8 (eight) hours as needed.   docusate sodium 100 MG capsule Commonly known as: COLACE Take 1 capsule (100 mg total) by mouth 2 (two) times daily for 14 days. For constipation   famotidine 20 MG tablet Commonly known as: Pepcid Take 1 tablet (20 mg total) by mouth 2 (two) times daily.   ibuprofen 600 MG tablet Commonly known as: ADVIL Take 1 tablet (600 mg total) by mouth every 6 (six) hours as needed.   metoCLOPramide 10 MG tablet Commonly  known as: Reglan Take 1 tablet (10 mg total) by mouth 3 (three) times daily with meals as needed for nausea.   NIFEdipine 60 MG 24 hr tablet Commonly known as: ADALAT CC Take 1 tablet (60 mg total) by mouth daily.   ONE-A-DAY WOMENS PRENATAL 1 PO Take by mouth.   oxyCODONE-acetaminophen 5-325 MG tablet Commonly known as: PERCOCET/ROXICET Take 1-2 tablets by mouth every 6 (six) hours as needed.   simethicone 80 MG chewable tablet Commonly known as: MYLICON Chew 1 tablet (80 mg total) by mouth 4 (four) times daily as needed for flatulence.         Discharge home in stable condition Infant Feeding: Breast Infant Disposition:NICU Discharge instruction: per After Visit Summary and Postpartum booklet. Activity: Advance as tolerated. Pelvic rest for 6 weeks.  Diet: routine diet Future Appointments: Future Appointments  Date Time Provider [place]  07/12/2021 10:40 AM [place] None  08/06/2021  1:10 PM Chancy Milroy, MD [place] None   Follow up Visit: Message sent to [facility] by Dr. Cy Blamer on 07/05/2021  Please schedule this patient for a In person postpartum visit in 4 weeks with the following provider: MD. Additional Postpartum F/U:Incision check 1 week and 3-4 months PP   High risk pregnancy complicated by: HTN Delivery mode:  C-Section, Low Transverse  Anticipated Birth Control:   Unsure   07/08/2021 Aletha Halim, MD

## 2021-07-06 ENCOUNTER — Other Ambulatory Visit (HOSPITAL_COMMUNITY): Payer: Self-pay

## 2021-07-06 LAB — CBC
HCT: 29.7 % — ABNORMAL LOW (ref 36.0–46.0)
Hemoglobin: 10.3 g/dL — ABNORMAL LOW (ref 12.0–15.0)
MCH: 30.8 pg (ref 26.0–34.0)
MCHC: 34.7 g/dL (ref 30.0–36.0)
MCV: 88.9 fL (ref 80.0–100.0)
Platelets: 168 10*3/uL (ref 150–400)
RBC: 3.34 MIL/uL — ABNORMAL LOW (ref 3.87–5.11)
RDW: 13.8 % (ref 11.5–15.5)
WBC: 19.3 10*3/uL — ABNORMAL HIGH (ref 4.0–10.5)
nRBC: 0 % (ref 0.0–0.2)

## 2021-07-06 LAB — HEMOGLOBIN: Hemoglobin: 9.6 g/dL — ABNORMAL LOW (ref 12.0–15.0)

## 2021-07-06 MED ORDER — CALCIUM CARBONATE ANTACID 500 MG PO CHEW
1.0000 | CHEWABLE_TABLET | ORAL | Status: DC | PRN
  Administered 2021-07-06 (×2): 200 mg via ORAL
  Filled 2021-07-06 (×3): qty 1

## 2021-07-06 MED ORDER — NIFEDIPINE ER OSMOTIC RELEASE 30 MG PO TB24
30.0000 mg | ORAL_TABLET | Freq: Every day | ORAL | Status: DC
  Administered 2021-07-06: 30 mg via ORAL
  Filled 2021-07-06: qty 1

## 2021-07-06 MED ORDER — FUROSEMIDE 40 MG PO TABS
20.0000 mg | ORAL_TABLET | Freq: Two times a day (BID) | ORAL | Status: DC
  Administered 2021-07-06 – 2021-07-07 (×3): 20 mg via ORAL
  Filled 2021-07-06 (×3): qty 1

## 2021-07-06 NOTE — Lactation Note (Signed)
This note was copied from a baby's chart. Lactation Consultation Note  Patient Name: Katrina Cain Today's Date: 07/06/2021 Reason for consult: Initial assessment;Primapara;1st time breastfeeding;Late-preterm 34-36.6wks Age:27 hours  Visited with mom of 68 75/22 weeks old (adjusted) NICU female, she's a P1 and has decided to just do formula. Reviewed engorgement prevention/treatment with mom in case she needed it at some point. NICU LC services no longer needed.  Feeding Mother's Current Feeding Choice: Formula  Interventions Interventions: Education  Discharge Discharge Education: Engorgement and breast care  Consult Status Consult Status: Complete Date: 07/06/21 Follow-up type: Call as needed   Lezli Danek [name] 07/06/2021, 12:34 PM

## 2021-07-06 NOTE — Progress Notes (Signed)
Subjective: Postpartum Day 1: Cesarean Delivery Arrest of descent and SPEC  Pt without complaints this morning. Denies HA or visual changes. Tolerating diet. Pain controlled  Objective: Vital signs in last 24 hours: Temp:  [97.7 F (36.5 C)-99.2 F (37.3 C)] 97.8 F (36.6 C) (09/23 0355) Pulse Rate:  [66-108] 81 (09/23 0355) Resp:  [8-26] 18 (09/23 0355) BP: (129-153)/(75-95) 143/91 (09/23 0355) SpO2:  [97 %-100 %] 100 % (09/23 0355)  Physical Exam:  General: alert Lochia: appropriate Uterine Fundus: firm Incision: healing well DVT Evaluation: No evidence of DVT seen on physical exam.  Recent Labs    07/05/21 1453 07/05/21 2346  HGB 9.8* 10.3*  HCT 29.0* 29.7*    Assessment/Plan: Status post Cesarean section. Doing well postoperatively.  BP stable. Will begin Procardia. Repeat labs in AM. Magnesium x 24 hrs Continue current care.  Hermina Staggers 07/06/2021, 7:31 AM

## 2021-07-07 LAB — COMPREHENSIVE METABOLIC PANEL
ALT: 27 U/L (ref 0–44)
AST: 30 U/L (ref 15–41)
Albumin: 2.3 g/dL — ABNORMAL LOW (ref 3.5–5.0)
Alkaline Phosphatase: 122 U/L (ref 38–126)
Anion gap: 5 (ref 5–15)
BUN: 11 mg/dL (ref 6–20)
CO2: 28 mmol/L (ref 22–32)
Calcium: 8.1 mg/dL — ABNORMAL LOW (ref 8.9–10.3)
Chloride: 101 mmol/L (ref 98–111)
Creatinine, Ser: 0.89 mg/dL (ref 0.44–1.00)
GFR, Estimated: >60 mL/min (ref >60)
Glucose, Bld: 79 mg/dL (ref 70–99)
Potassium: 3.5 mmol/L (ref 3.5–5.1)
Sodium: 134 mmol/L — ABNORMAL LOW (ref 135–145)
Total Bilirubin: 0.6 mg/dL (ref 0.3–1.2)
Total Protein: 4.9 g/dL — ABNORMAL LOW (ref 6.5–8.1)

## 2021-07-07 LAB — CBC
HCT: 27.3 % — ABNORMAL LOW (ref 36.0–46.0)
Hemoglobin: 9.5 g/dL — ABNORMAL LOW (ref 12.0–15.0)
MCH: 31 pg (ref 26.0–34.0)
MCHC: 34.8 g/dL (ref 30.0–36.0)
MCV: 89.2 fL (ref 80.0–100.0)
Platelets: 135 10*3/uL — ABNORMAL LOW (ref 150–400)
RBC: 3.06 MIL/uL — ABNORMAL LOW (ref 3.87–5.11)
RDW: 14.1 % (ref 11.5–15.5)
WBC: 15 10*3/uL — ABNORMAL HIGH (ref 4.0–10.5)
nRBC: 0.3 % — ABNORMAL HIGH (ref 0.0–0.2)

## 2021-07-07 MED ORDER — NIFEDIPINE ER OSMOTIC RELEASE 60 MG PO TB24
60.0000 mg | ORAL_TABLET | Freq: Every day | ORAL | Status: DC
  Administered 2021-07-07 – 2021-07-08 (×2): 60 mg via ORAL
  Filled 2021-07-07 (×2): qty 1

## 2021-07-07 NOTE — Lactation Note (Signed)
This note was copied from a baby's chart. Lactation Consultation Note  Patient Name: Katrina Cain Today's Date: 07/07/2021 Reason for consult: Follow-up assessment;Primapara;1st time breastfeeding;Late-preterm 34-36.6wks;Infant < 6lbs;NICU baby Age:27 hours  Visited with mom of 7 59/22 weeks old (adjusted) NICU female, she's a P1 and reported (+) breast changes during the pregnancy. Mom initially decided to formula feed only but she changed her mind and asked NICU RN Katrina Cain for lactation.  LC assisted with hand expression and pumping, mom still pumping when exiting the room. Milk is coming in to volume, she had a least 2 ounces on this first pumping session. Noticed that breast were getting hard and heavy, but mom said they felt much better after she started pumping.   Reviewed pumping schedule, pumping expectations, lactogenesis II, engorgement prevention/treatment and benefits of premature milk. Mom doesn't have a pump at home, a referral to the [facility] office was faxed today.  Plan of care:  Encouraged mom to start pumping consistently every 2-3 hours Hand expression and breast massage were also encouraged prior pumping  BF brochure, BF resources and NICU booklet were reviewed. No other support person at this time. All questions and concerns answered, mom aware of NICU LC services and will call PRN.  Maternal Data Has patient been taught Hand Expression?: Yes Does the patient have breastfeeding experience prior to this delivery?: No  Feeding Mother's Current Feeding Choice: Breast Milk and Formula (she changed her mind)  Lactation Tools Discussed/Used Tools: Pump;Flanges Flange Size: 24 Breast pump type: Double-Electric Breast Pump Pump Education: Setup, frequency, and cleaning;Milk Storage Reason for Pumping: LPI in NICU Pumping frequency: q 3 hours (recomended) Pumped volume:  (drops)  Interventions Interventions: Breast feeding basics reviewed;DEBP;Education;Hand  express;Breast massage  Discharge Pump: DEBP WIC Program: Yes  Consult Status Consult Status: Follow-up Date: 07/07/21 Follow-up type: In-patient  Katrina Cain Katrina Cain 07/07/2021, 1:20 PM

## 2021-07-07 NOTE — Progress Notes (Signed)
Subjective: Postpartum Day 2: Cesarean Delivery Patient reports tolerating PO and no problems voiding.    Objective: Vital signs in last 24 hours: Temp:  [98 F (36.7 C)-98.6 F (37 C)] 98.3 F (36.8 C) (09/24 0333) Pulse Rate:  [70-103] 89 (09/24 0333) Resp:  [16-18] 18 (09/24 0333) BP: (138-157)/(80-98) 151/96 (09/24 0333) SpO2:  [100 %] 100 % (09/24 0333) Vitals with BMI 07/07/2021 07/06/2021 07/06/2021  Height - - -  Weight - - -  BMI - - -  Systolic 151 155 361  Diastolic 96 94 96  Pulse 89 103 86   Intake/Output Summary (Last 24 hours) at 07/07/2021 0743 Last data filed at 07/07/2021 0529 Gross per 24 hour  Intake 973.9 ml  Output 2900 ml  Net -1926.1 ml     Physical Exam:  General: alert, cooperative, and appears stated age 25: appropriate Uterine Fundus: firm Incision: no significant drainage DVT Evaluation: No evidence of DVT seen on physical exam.  Recent Labs    07/05/21 2346 07/06/21 0703 07/07/21 0427  HGB 10.3* 9.6* 9.5*  HCT 29.7*  --  27.3*   CMP Latest Ref Rng & Units 07/07/2021 07/05/2021 07/05/2021  Glucose 70 - 99 mg/dL 79 443(X) 93  BUN 6 - 20 mg/dL 11 14 17   Creatinine 0.44 - 1.00 mg/dL 5.40) 0.86(P)  Sodium 135 - 145 mmol/L 134(L) 135 135  Potassium 3.5 - 5.1 mmol/L 3.5 3.7 3.8  Chloride 98 - 111 mmol/L 101 104 104  CO2 22 - 32 mmol/L 28 20(L) 18(L)  Calcium 8.9 - 10.3 mg/dL 8.1(L) 7.1(L) 7.8(L)  Total Protein 6.5 - 8.1 g/dL 4.9(L) 5.5(L) 5.7(L)  Total Bilirubin 0.3 - 1.2 mg/dL 0.6 0.8 0.7  Alkaline Phos 38 - 126 U/L 122 166(H) 168(H)  AST 15 - 41 U/L 30 43(H) 28  ALT 0 - 44 U/L 27 35 25    Assessment/Plan: Status post Cesarean section. Doing well postoperatively.  Added Lasix bid Increased Procardia to 60 mg Continue current care.  6.19(J 07/07/2021, 7:40 AM

## 2021-07-08 ENCOUNTER — Ambulatory Visit: Payer: Self-pay

## 2021-07-08 LAB — BASIC METABOLIC PANEL
Anion gap: 7 (ref 5–15)
BUN: 8 mg/dL (ref 6–20)
CO2: 29 mmol/L (ref 22–32)
Calcium: 8.8 mg/dL — ABNORMAL LOW (ref 8.9–10.3)
Chloride: 99 mmol/L (ref 98–111)
Creatinine, Ser: 0.74 mg/dL (ref 0.44–1.00)
GFR, Estimated: >60 mL/min (ref >60)
Glucose, Bld: 76 mg/dL (ref 70–99)
Potassium: 3.5 mmol/L (ref 3.5–5.1)
Sodium: 135 mmol/L (ref 135–145)

## 2021-07-08 MED ORDER — OXYCODONE-ACETAMINOPHEN 5-325 MG PO TABS: 1.0000 | ORAL_TABLET | Freq: Four times a day (QID) | ORAL | 0 refills | Status: DC | PRN

## 2021-07-08 MED ORDER — IBUPROFEN 600 MG PO TABS
600.0000 mg | ORAL_TABLET | Freq: Four times a day (QID) | ORAL | 0 refills | Status: DC | PRN
Start: 2021-07-08 — End: 2021-08-06

## 2021-07-08 MED ORDER — DOCUSATE SODIUM 100 MG PO CAPS: 100.0000 mg | ORAL_CAPSULE | Freq: Two times a day (BID) | ORAL | 2 refills | Status: AC

## 2021-07-08 MED ORDER — ACETAMINOPHEN 500 MG PO TABS: 1000.0000 mg | ORAL_TABLET | Freq: Three times a day (TID) | ORAL | 0 refills | Status: DC | PRN

## 2021-07-08 MED ORDER — SIMETHICONE 80 MG PO CHEW: 80.0000 mg | CHEWABLE_TABLET | Freq: Four times a day (QID) | ORAL | 0 refills | Status: DC | PRN

## 2021-07-08 MED ORDER — NIFEDIPINE ER 60 MG PO TB24
60.0000 mg | ORAL_TABLET | Freq: Every day | ORAL | 0 refills | Status: DC
Start: 2021-07-08 — End: 2022-03-26

## 2021-07-08 NOTE — Lactation Note (Signed)
This note was copied from a baby's chart. Lactation Consultation Note  Patient Name: Katrina Cain Today's Date: 07/08/2021 Reason for consult: Follow-up assessment;1st time breastfeeding;Primapara;Late-preterm 34-36.6wks;Infant < 6lbs;NICU baby;Other (Comment) (maternal discharge) Age:27 hours  Visited with mom of 27 40/40 weeks old (adjusted) NICU female; she's a P1 and continues to work on pumping but it hasn't been consistently.  Explained to mom again the importance of consistent pumping, she voiced that she has a brand new DEBP for home use and she'll use it. She'll be staying in baby's room tonight and whenever she's at the hospital, it was encouraged to use the hospital grade pump.  Reviewed pumping schedule, lactogenesis II and supply/demand.  Plan of care:   Encouraged mom to start pumping consistently every 2-3 hours Hand expression and breast massage were also encouraged prior pumping   No other support person at this time. All questions and concerns answered, mom aware of NICU LC services and will call PRN.  Maternal Data   Mom's milk supply has decreased in comparison to her first pumping session, she was disappointed but voiced that she'll keep trying.  Feeding Mother's Current Feeding Choice: Breast Milk and Formula  Lactation Tools Discussed/Used Tools: Pump;Flanges Flange Size: 24 Breast pump type: Double-Electric Breast Pump Pump Education: Setup, frequency, and cleaning;Milk Storage Reason for Pumping: LPI in NICU Pumping frequency: 3 times/24 hours Pumped volume: 10 mL  Interventions Interventions: Breast feeding basics reviewed;DEBP;Education  Discharge Discharge Education: Engorgement and breast care Pump: DEBP  Consult Status Consult Status: Follow-up Date: 07/08/21 Follow-up type: In-patient  Akshita Italiano [name] 07/08/2021, 5:23 PM

## 2021-07-08 NOTE — Progress Notes (Signed)
Pt discharged to home

## 2021-07-09 ENCOUNTER — Ambulatory Visit: Payer: Self-pay

## 2021-07-09 ENCOUNTER — Other Ambulatory Visit: Payer: Self-pay | Admitting: Obstetrics and Gynecology

## 2021-07-09 ENCOUNTER — Telehealth: Payer: Self-pay

## 2021-07-09 LAB — SURGICAL PATHOLOGY

## 2021-07-09 NOTE — Lactation Note (Signed)
This note was copied from a baby's chart. Lactation Consultation Note Mother is no longer pumping. She elects to formula feed only. We reviewed s/s of engorgement and treatment of engorgement prn.  LC services are complete.  Patient Name: Katrina Cain Today's Date: 07/09/2021 Reason for consult: Follow-up assessment Age:27 days   Feeding Mother's Current Feeding Choice: Formula Nipple Type: Nfant Extra Slow Flow (gold)  Interventions Interventions: Education   Consult Status Consult Status: Complete (mother declined follow up) Follow-up type: In-patient   Elder Negus 07/09/2021, 5:34 PM

## 2021-07-09 NOTE — Telephone Encounter (Signed)
Transition Care Management Follow-up Telephone Call Date of discharge and from where: 07/08/2021-[place]  How have you been since you were released from the hospital? Patient stated she is doing fine. Any questions or concerns? No  Items Reviewed: Did the pt receive and understand the discharge instructions provided? Yes  Medications obtained and verified? Yes  Other? No  Any new allergies since your discharge? No  Dietary orders reviewed? No Do you have support at home? Yes   Home Care and Equipment/Supplies: Were home health services ordered? not applicable If so, what is the name of the agency? N/A  Has the agency set up a time to come to the patient's home? not applicable Were any new equipment or medical supplies ordered?  No What is the name of the medical supply agency? N/A Were you able to get the supplies/equipment? not applicable Do you have any questions related to the use of the equipment or supplies? No  Functional Questionnaire: (I = Independent and D = Dependent) ADLs: I  Bathing/Dressing- I  Meal Prep- I  Eating- I  Maintaining continence- I  Transferring/Ambulation- I  Managing Meds- I  Follow up appointments reviewed:  PCP Hospital f/u appt confirmed? No   Specialist Hospital f/u appt confirmed? Yes  Scheduled to see Dr. Alysia Penna on 08/06/2021 @ 1:10pm. Are transportation arrangements needed? No  If their condition worsens, is the pt aware to call PCP or go to the Emergency Dept.? Yes Was the patient provided with contact information for the PCP's office or ED? Yes Was to pt encouraged to call back with questions or concerns? Yes

## 2021-07-12 ENCOUNTER — Other Ambulatory Visit: Payer: Self-pay

## 2021-07-12 ENCOUNTER — Ambulatory Visit (INDEPENDENT_AMBULATORY_CARE_PROVIDER_SITE_OTHER): Payer: Medicaid Other | Admitting: Obstetrics

## 2021-07-12 VITALS — BP 147/90 | HR 97

## 2021-07-12 DIAGNOSIS — Z5189 Encounter for other specified aftercare: Secondary | ICD-10-CM

## 2021-07-12 NOTE — Progress Notes (Signed)
I reviewed the nurses note and agree with the plan of care.   Fawne Hughley A, MD 01/09/2018 10:46 AM  

## 2021-07-12 NOTE — Progress Notes (Addendum)
Pt is in the office for incision check after primary c-section on 07-05-21. Pt denies any complications with incision. Honeycomb dressing was removed in office today and incision appeared to be clean, dry, and intact. Advised pt to keep incision clean and dry at home, and advised of signs of infection.  Marland Kitchen.Subjective:  Katrina Cain is a 25 y.o. female here for BP check.   Hypertension ROS: taking medications as instructed, no medication side effects noted, no TIA's, no chest pain on exertion, no dyspnea on exertion, and no swelling of ankles.    Objective:  BP (!) 147/90   Pulse 97   Appearance alert, well appearing, and in no distress. General exam BP noted to be well controlled today in office.    Assessment:   Blood Pressure control uncertain.  Pt states that she just took prescribed Nifedipine 30 minutes before appt, pt denies any abnormal symptoms.  Plan:  Current treatment plan is effective, no change in therapy. Consulted with provider and advised pt to continue medication, return in 2 weeks for BP check, and advised pt to report any abnormal symptoms, pt agreed.  F/U BP visit scheduled for 07/26/21 PP visit scheduled for 08/06/21  Patient was assessed and managed by nursing staff during this encounter. I have reviewed the chart and agree with the documentation and plan. I have also made any necessary editorial changes.  Coral Ceo, MD 07/12/2021 12:34 PM

## 2021-07-17 ENCOUNTER — Telehealth (HOSPITAL_COMMUNITY): Payer: Self-pay | Admitting: *Deleted

## 2021-07-17 NOTE — Telephone Encounter (Signed)
Phone call to patient. Patient reported that she is "in the middle of being discharged from NICU" with her infant. Asked RN to call back in "10 minutes." Return call placed to patient. No answer received. RN left message for patient to return call. Deforest Hoyles, RN, 07/17/21, [id number].

## 2021-07-18 ENCOUNTER — Other Ambulatory Visit: Payer: Self-pay

## 2021-07-18 ENCOUNTER — Ambulatory Visit (INDEPENDENT_AMBULATORY_CARE_PROVIDER_SITE_OTHER): Payer: Medicaid Other | Admitting: Obstetrics

## 2021-07-18 ENCOUNTER — Encounter: Payer: Self-pay | Admitting: Obstetrics

## 2021-07-18 DIAGNOSIS — Z30017 Encounter for initial prescription of implantable subdermal contraceptive: Secondary | ICD-10-CM | POA: Diagnosis not present

## 2021-07-18 LAB — POCT URINE PREGNANCY: Preg Test, Ur: NEGATIVE

## 2021-07-18 MED ORDER — ETONOGESTREL 68 MG ~~LOC~~ IMPL
68.0000 mg | DRUG_IMPLANT | Freq: Once | SUBCUTANEOUS | Status: AC
  Administered 2021-07-18: 68 mg via SUBCUTANEOUS

## 2021-07-18 MED ORDER — LEVONORGESTREL 20 MCG/DAY IU IUD: 1.0000 | INTRAUTERINE_SYSTEM | Freq: Once | INTRAUTERINE | Status: DC

## 2021-07-18 NOTE — Progress Notes (Signed)
Nexplanon Procedure Note   PRE-OP DIAGNOSIS: Desired Long-Acting, Reversible Contraception ( LARC ) POST-OP DIAGNOSIS: Same  PROCEDURE: Nexplanon  Insertion Performing Provider:  Bing Neighbors. Clearance Coots MD  Patient education prior to procedure, explained risk, benefits of Nexplanon, reviewed alternative options. Patient reported understanding. Gave consent to continue with procedure.   PROCEDURE:  Pregnancy Text :  Negative Site (check):      left arm         Sterile Preparation:   Betadinex3 Lot # [id number] Expiration Date 2024APR09  Insertion site was selected 8 - 10 cm from medial epicondyle and marked along with guiding site using sterile marker. Procedure area was prepped and draped in a sterile fashion. 1% Lidocaine 1.5 ml given prior to procedure. Nexplanon  was inserted subcutaneously.Needle was removed from the insertion site. Nexplanon capsule was palpated by provider and patient to assure satisfactory placement. Dressing applied.  Followup: The patient tolerated the procedure well without complications.  Standard post-procedure care is explained and return precautions are given.  Brock Bad, MD 07/18/2021 10:43 AM

## 2021-07-18 NOTE — Progress Notes (Addendum)
1 week PP presents for Nexplanon Insertion.  Last unprotected sex was 2 months ago. UPT today is NEGATIVE.  BP is elevated, she took her medication less than a hour ago.  Administrations This Visit     etonogestrel (NEXPLANON) implant 68 mg     Admin Date 07/18/2021 Action Given Dose 68 mg Route Subdermal Administered By Maretta Bees, RMA

## 2021-07-26 ENCOUNTER — Ambulatory Visit: Payer: Medicaid Other

## 2021-07-26 ENCOUNTER — Ambulatory Visit: Payer: Medicaid Other | Admitting: Obstetrics

## 2021-07-27 ENCOUNTER — Ambulatory Visit: Payer: Medicaid Other | Admitting: Obstetrics

## 2021-08-01 ENCOUNTER — Other Ambulatory Visit: Payer: Self-pay

## 2021-08-01 ENCOUNTER — Ambulatory Visit (INDEPENDENT_AMBULATORY_CARE_PROVIDER_SITE_OTHER): Payer: Medicaid Other | Admitting: Obstetrics

## 2021-08-01 ENCOUNTER — Encounter: Payer: Self-pay | Admitting: Obstetrics

## 2021-08-01 DIAGNOSIS — Z3046 Encounter for surveillance of implantable subdermal contraceptive: Secondary | ICD-10-CM

## 2021-08-01 DIAGNOSIS — O8601 Infection of obstetric surgical wound, superficial incisional site: Secondary | ICD-10-CM

## 2021-08-01 DIAGNOSIS — T8149XA Infection following a procedure, other surgical site, initial encounter: Secondary | ICD-10-CM

## 2021-08-01 MED ORDER — VITAFOL ULTRA 29-0.6-0.4-200 MG PO CAPS: 1.0000 | ORAL_CAPSULE | Freq: Every day | ORAL | 4 refills | Status: DC

## 2021-08-01 MED ORDER — AMOXICILLIN-POT CLAVULANATE 875-125 MG PO TABS: 1.0000 | ORAL_TABLET | Freq: Two times a day (BID) | ORAL | 0 refills | Status: DC

## 2021-08-01 NOTE — Progress Notes (Signed)
Pt presents for BP check s/p c/s on 07/05/21. Pt needs routine pp visit. Pt took BP meds an hour ago.  Pt is experiencing irritation at the incision site.  EPDS= 0

## 2021-08-01 NOTE — Progress Notes (Signed)
Subjective:     Katrina Cain is a 25 y.o. female who presents for a postpartum visit. She is 4 weeks postpartum following a low cervical transverse Cesarean section. I have fully reviewed the prenatal and intrapartum course. The delivery was at 34 gestational weeks. Outcome: primary cesarean section, low transverse incision. Anesthesia: epidural. Postpartum course has been normal. Baby's course has been normal, initially in NICU. Baby is feeding by bottle. Bleeding thin lochia. Bowel function is normal. Bladder function is normal. Patient is not sexually active. Contraception method is abstinence. Postpartum depression screening: negative.  Tobacco, alcohol and substance abuse history reviewed.  Adult immunizations reviewed including TDAP, rubella and varicella.  The following portions of the patient's history were reviewed and updated as appropriate: allergies, current medications, past family history, past medical history, past social history, past surgical history, and problem list.  Review of Systems A comprehensive review of systems was negative.   Objective:    BP 131/76   Pulse (!) 106   Wt 142 lb 6.4 oz (64.6 kg)   Breastfeeding No   BMI 21.65 kg/m   General:  alert and no distress   Breasts:  inspection negative, no nipple discharge or bleeding, no masses or nodularity palpable  Lungs: clear to auscultation bilaterally  Heart:  regular rate and rhythm, S1, S2 normal, no murmur, click, rub or gallop  Abdomen: abnormal findings:  small area that is  open in the left corner of the incision that drained scant amount of bloody fluid.  The area was probed with a Q-tip and no further drainage expressed.  The area was cleansed with peroxide followed by betadine, and covered with a 4x4 Gauze.                Left Upper Extremity:  Nexplanon insertion site is clean, dry and non-tender.  Rod palpated.              Pelvic exam:  Deferred  I have spent a total of 20 minutes of face-to-face  time, excluding clinical staff time, reviewing notes and preparing to see patient, ordering tests and/or medications, and counseling the patient.    Assessment:   1. Postpartum care following cesarean delivery - doing well  2. Wound infection after surgery Rx: - amoxicillin-clavulanate (AUGMENTIN) 875-125 MG tablet; Take 1 tablet by mouth 2 (two) times daily.  Dispense: 14 tablet; Refill: 0  3. Encounter for surveillance of Nexplanon subdermal contraceptive - pleased with Nexplanon    Plan:    1. Contraception: Nexplanon 2. Continue PNV's 3. Follow up in: 4 weeks or as needed.  Healthy lifestyle practices reviewed    Brock Bad, MD 08/01/2021 2:40 PM

## 2021-08-06 ENCOUNTER — Encounter: Payer: Self-pay | Admitting: Obstetrics and Gynecology

## 2021-08-06 ENCOUNTER — Telehealth (INDEPENDENT_AMBULATORY_CARE_PROVIDER_SITE_OTHER): Payer: Medicaid Other | Admitting: Obstetrics and Gynecology

## 2021-08-06 DIAGNOSIS — R87613 High grade squamous intraepithelial lesion on cytologic smear of cervix (HGSIL): Secondary | ICD-10-CM

## 2021-08-06 DIAGNOSIS — O99893 Other specified diseases and conditions complicating puerperium: Secondary | ICD-10-CM

## 2021-08-06 NOTE — Patient Instructions (Signed)
Health Maintenance, Female Adopting a healthy lifestyle and getting preventive care are important in promoting health and wellness. Ask your health care provider about: The right schedule for you to have regular tests and exams. Things you can do on your own to prevent diseases and keep yourself healthy. What should I know about diet, weight, and exercise? Eat a healthy diet  Eat a diet that includes plenty of vegetables, fruits, low-fat dairy products, and lean protein. Do not eat a lot of foods that are high in solid fats, added sugars, or sodium. Maintain a healthy weight Body mass index (BMI) is used to identify weight problems. It estimates body fat based on height and weight. Your health care provider can help determine your BMI and help you achieve or maintain a healthy weight. Get regular exercise Get regular exercise. This is one of the most important things you can do for your health. Most adults should: Exercise for at least 150 minutes each week. The exercise should increase your heart rate and make you sweat (moderate-intensity exercise). Do strengthening exercises at least twice a week. This is in addition to the moderate-intensity exercise. Spend less time sitting. Even light physical activity can be beneficial. Watch cholesterol and blood lipids Have your blood tested for lipids and cholesterol at 27 years of age, then have this test every 5 years. Have your cholesterol levels checked more often if: Your lipid or cholesterol levels are high. You are older than 27 years of age. You are at high risk for heart disease. What should I know about cancer screening? Depending on your health history and family history, you may need to have cancer screening at various ages. This may include screening for: Breast cancer. Cervical cancer. Colorectal cancer. Skin cancer. Lung cancer. What should I know about heart disease, diabetes, and high blood pressure? Blood pressure and heart  disease High blood pressure causes heart disease and increases the risk of stroke. This is more likely to develop in people who have high blood pressure readings, are of African descent, or are overweight. Have your blood pressure checked: Every 3-5 years if you are 18-39 years of age. Every year if you are 40 years old or older. Diabetes Have regular diabetes screenings. This checks your fasting blood sugar level. Have the screening done: Once every three years after age 40 if you are at a normal weight and have a low risk for diabetes. More often and at a younger age if you are overweight or have a high risk for diabetes. What should I know about preventing infection? Hepatitis B If you have a higher risk for hepatitis B, you should be screened for this virus. Talk with your health care provider to find out if you are at risk for hepatitis B infection. Hepatitis C Testing is recommended for: Everyone born from 1945 through 1965. Anyone with known risk factors for hepatitis C. Sexually transmitted infections (STIs) Get screened for STIs, including gonorrhea and chlamydia, if: You are sexually active and are younger than 27 years of age. You are older than 27 years of age and your health care provider tells you that you are at risk for this type of infection. Your sexual activity has changed since you were last screened, and you are at increased risk for chlamydia or gonorrhea. Ask your health care provider if you are at risk. Ask your health care provider about whether you are at high risk for HIV. Your health care provider may recommend a prescription medicine   to help prevent HIV infection. If you choose to take medicine to prevent HIV, you should first get tested for HIV. You should then be tested every 3 months for as long as you are taking the medicine. Pregnancy If you are about to stop having your period (premenopausal) and you may become pregnant, seek counseling before you get  pregnant. Take 400 to 800 micrograms (mcg) of folic acid every day if you become pregnant. Ask for birth control (contraception) if you want to prevent pregnancy. Osteoporosis and menopause Osteoporosis is a disease in which the bones lose minerals and strength with aging. This can result in bone fractures. If you are 65 years old or older, or if you are at risk for osteoporosis and fractures, ask your health care provider if you should: Be screened for bone loss. Take a calcium or vitamin D supplement to lower your risk of fractures. Be given hormone replacement therapy (HRT) to treat symptoms of menopause. Follow these instructions at home: Lifestyle Do not use any products that contain nicotine or tobacco, such as cigarettes, e-cigarettes, and chewing tobacco. If you need help quitting, ask your health care provider. Do not use street drugs. Do not share needles. Ask your health care provider for help if you need support or information about quitting drugs. Alcohol use Do not drink alcohol if: Your health care provider tells you not to drink. You are pregnant, may be pregnant, or are planning to become pregnant. If you drink alcohol: Limit how much you use to 0-1 drink a day. Limit intake if you are breastfeeding. Be aware of how much alcohol is in your drink. In the [place] one drink equals one 12 oz bottle of beer (355 mL), one 5 oz glass of wine (148 mL), or one 1 oz glass of hard liquor (44 mL). General instructions Schedule regular health, dental, and eye exams. Stay current with your vaccines. Tell your health care provider if: You often feel depressed. You have ever been abused or do not feel safe at home. Summary Adopting a healthy lifestyle and getting preventive care are important in promoting health and wellness. Follow your health care provider's instructions about healthy diet, exercising, and getting tested or screened for diseases. Follow your health care provider's  instructions on monitoring your cholesterol and blood pressure. This information is not intended to replace advice given to you by your health care provider. Make sure you discuss any questions you have with your health care provider. Document Revised: 12/08/2020 Document Reviewed: 09/23/2018 Elsevier Patient Education  2022 Elsevier Inc.  

## 2021-08-06 NOTE — Progress Notes (Signed)
    Post Partum Visit Note  Katrina Cain is a 25 y.o. [id number] female who presents for a postpartum visit. She is 1 month postpartum following a primary cesarean section.  I have fully reviewed the prenatal and intrapartum course. The delivery was at 34 gestational weeks. Anesthesia: epidural. Postpartum course has been uncomplicated. Baby is doing well. Baby is feeding by bottle - [employer] Good Start. Bleeding moderate lochia. Bowel function is normal. Bladder function is normal. Patient is not sexually active. Contraception method is Nexplanon. Postpartum depression screening: negative.   The pregnancy intention screening data noted above was reviewed. Potential methods of contraception were discussed. The patient elected to proceed with No data recorded.    Health Maintenance Due  Topic Date Due   COVID-19 Vaccine (1) Never done   INFLUENZA VACCINE  Never done    Medical records  Review of Systems Pertinent items noted in HPI and remainder of comprehensive ROS otherwise negative.  Objective:        Assessment:      Nl postpartum exam.  Abnormal pap smear GHTN Plan:   Essential components of care per ACOG recommendations:  1.  Mood and well being: Patient with negative depression screening today. Reviewed local resources for support.  - Patient tobacco use? No.   - hx of drug use? No.    2. Infant care and feeding:  -Patient currently breastmilk feeding? No.  -Social determinants of health (SDOH) reviewed in EPIC. No concerns    - Patient does not want a pregnancy in the next year.  Desired family size is uncertain  - Reviewed forms of contraception in tiered fashion. Patient has  Nexplanon . - Discussed birth spacing of 18 months  4. Sleep and fatigue -Encouraged family/partner/community support of 4 hrs of uninterrupted sleep to help with mood and fatigue  5. Physical Recovery  - Discussed patients delivery and complications. She describes her labor as  good. - Patient had a C-section.  Patient expressed understanding - Patient has urinary incontinence? No. - Patient is safe to resume physical and sexual activity  6.  Health Maintenance - HM due items addressed Yes - Last pap smear  Diagnosis  Date Value Ref Range Status  10/04/2020 (A)  Final   - High grade squamous intraepithelial lesion (HSIL)   Pap smear not done at today's visit.  -Breast Cancer screening indicated? No.   7. Chronic Disease/Pregnancy Condition follow up: Hypertension  - PCP follow up   Pt with be scheduled for repeat pap smear in December 2022 Advised to continue with Procardia and follow up with PCP for BP check and further management as needed   Today's visit was a My chart visit from [facility]. Pt was identified as per routine protocol. Total time spent 8 minutes  Nettie Elm, MD [facility] [employer], [facility]

## 2021-09-20 ENCOUNTER — Other Ambulatory Visit: Payer: Self-pay

## 2021-09-20 ENCOUNTER — Other Ambulatory Visit (HOSPITAL_COMMUNITY)
Admission: RE | Admit: 2021-09-20 | Discharge: 2021-09-20 | Disposition: A | Discharge status: Home / Self Care | Payer: Medicaid Other | Source: Ambulatory Visit | Attending: Obstetrics and Gynecology | Admitting: Obstetrics and Gynecology

## 2021-09-20 ENCOUNTER — Ambulatory Visit (INDEPENDENT_AMBULATORY_CARE_PROVIDER_SITE_OTHER): Payer: Medicaid Other

## 2021-09-20 DIAGNOSIS — N898 Other specified noninflammatory disorders of vagina: Secondary | ICD-10-CM

## 2021-09-20 NOTE — Progress Notes (Signed)
Agree with nurses's documentation of this patient's clinic encounter.  Faviola Klare L, MD  

## 2021-09-20 NOTE — Progress Notes (Addendum)
SUBJECTIVE:  25 y.o. female complains of white vaginal discharge for 1 week(s). Denies abnormal vaginal bleeding or significant pelvic pain or fever. No UTI symptoms. Denies history of known exposure to STD.  Patient's last menstrual period was 09/10/2021 (approximate).  OBJECTIVE:  She appears well, afebrile. Urine dipstick: not done.  ASSESSMENT:  Vaginal Discharge  Vaginal Spotting Patient has Nexplanon for [facility], last PAP 10/04/20 which was abnormal. Appointment for repeat PAP 11/06/2021   PLAN:  GC, chlamydia, trichomonas, BVAG, CVAG probe sent to lab. Treatment: To be determined once lab results are received ROV prn if symptoms persist or worsen.

## 2021-09-21 LAB — CERVICOVAGINAL ANCILLARY ONLY
Bacterial Vaginitis (gardnerella): NEGATIVE
Candida Glabrata: NEGATIVE
Candida Vaginitis: POSITIVE — AB
Chlamydia: NEGATIVE
Comment: NEGATIVE
Comment: NEGATIVE
Comment: NEGATIVE
Comment: NEGATIVE
Comment: NEGATIVE
Comment: NORMAL
Neisseria Gonorrhea: NEGATIVE
Trichomonas: NEGATIVE

## 2021-09-26 ENCOUNTER — Other Ambulatory Visit: Payer: Self-pay

## 2021-09-26 DIAGNOSIS — B3731 Acute candidiasis of vulva and vagina: Secondary | ICD-10-CM

## 2021-09-26 MED ORDER — FLUCONAZOLE 150 MG PO TABS: 150.0000 mg | ORAL_TABLET | Freq: Once | ORAL | 0 refills | Status: AC

## 2021-10-16 ENCOUNTER — Encounter: Payer: Self-pay | Admitting: Obstetrics and Gynecology

## 2021-10-16 ENCOUNTER — Encounter: Payer: Self-pay | Admitting: Obstetrics

## 2021-11-06 ENCOUNTER — Ambulatory Visit: Payer: Medicaid Other | Admitting: Obstetrics & Gynecology

## 2021-12-14 ENCOUNTER — Ambulatory Visit: Payer: Medicaid Other | Admitting: Obstetrics & Gynecology

## 2022-01-29 ENCOUNTER — Ambulatory Visit: Payer: Medicaid Other | Admitting: Obstetrics & Gynecology

## 2022-03-26 ENCOUNTER — Encounter: Payer: Self-pay | Admitting: Obstetrics & Gynecology

## 2022-03-26 ENCOUNTER — Ambulatory Visit (INDEPENDENT_AMBULATORY_CARE_PROVIDER_SITE_OTHER): Payer: Medicaid Other | Admitting: Obstetrics & Gynecology

## 2022-03-26 ENCOUNTER — Other Ambulatory Visit (HOSPITAL_COMMUNITY)
Admission: RE | Admit: 2022-03-26 | Discharge: 2022-03-26 | Disposition: A | Discharge status: Home / Self Care | Payer: Medicaid Other | Source: Ambulatory Visit | Attending: Obstetrics & Gynecology | Admitting: Obstetrics & Gynecology

## 2022-03-26 VITALS — BP 123/78 | HR 80 | Ht 68.0 in | Wt 163.0 lb

## 2022-03-26 DIAGNOSIS — R87613 High grade squamous intraepithelial lesion on cytologic smear of cervix (HGSIL): Secondary | ICD-10-CM | POA: Insufficient documentation

## 2022-03-26 DIAGNOSIS — Z01419 Encounter for gynecological examination (general) (routine) without abnormal findings: Secondary | ICD-10-CM | POA: Insufficient documentation

## 2022-03-26 MED ORDER — NAPROXEN 500 MG PO TABS: 500.0000 mg | ORAL_TABLET | Freq: Two times a day (BID) | ORAL | 6 refills | Status: DC | PRN

## 2022-03-26 NOTE — Progress Notes (Signed)
Patient ID: Katrina Cain, female   DOB: [birth year], 26 y.o.   MRN: [id number]  Chief Complaint  Patient presents with   Gynecologic Exam   Annual Exam    HPI Katrina Cain is a 26 y.o. female.  [id number] Patient's last menstrual period was 03/18/2022 (approximate). Patient has Nexplanon and although her menses are mostly regular and only last 2 days she has cramps that are not relieved with OTC Aleve. Naproxyn 500 mg worked well. She needs f/u for HSIL pap 12/21. HPI  Past Medical History:  Diagnosis Date   Asthma    GERD (gastroesophageal reflux disease)    Vaginal Pap smear, abnormal     Past Surgical History:  Procedure Laterality Date   CESAREAN SECTION N/A 07/05/2021   Procedure: CESAREAN SECTION;  Surgeon: Gwynne Edinger, MD;  Location: [facility] LD ORS;  Service: Obstetrics;  Laterality: N/A;   NO PAST SURGERIES      Family History  Problem Relation Age of Onset   Diabetes Mother    Hypertension Mother    Cancer Father    Cancer Paternal Grandmother     Social History Social History   Tobacco Use   Smoking status: Former    Types: Cigarettes    Quit date: 11/14/2020    Years since quitting: 1.3   Smokeless tobacco: Never  Vaping Use   Vaping Use: Never used  Substance Use Topics   Alcohol use: No   Drug use: Not Currently    Types: Marijuana    Comment: Stopped when pregnancy confirmed    No Known Allergies  Current Outpatient Medications  Medication Sig Dispense Refill   naproxen (NAPROSYN) 500 MG tablet Take 1 tablet (500 mg total) by mouth every 12 (twelve) hours as needed for mild pain or moderate pain. 30 tablet 6   Prenat-Fe Poly-Methfol-FA-DHA (VITAFOL ULTRA) 29-0.6-0.4-200 MG CAPS Take 1 capsule by mouth daily before breakfast. (Patient not taking: Reported on 03/26/2022) 90 capsule 4   No current facility-administered medications for this visit.    Review of Systems Review of Systems  Constitutional: Negative.   Respiratory: Negative.     Cardiovascular: Negative.   Gastrointestinal: Negative.   Genitourinary:  Positive for menstrual problem and pelvic pain (cramps). Negative for vaginal discharge.    Blood pressure 123/78, pulse 80, height 5\' 8"  (1.727 m), weight 163 lb (73.9 kg), last menstrual period 03/18/2022, unknown if currently breastfeeding.  Physical Exam Physical Exam Vitals and nursing note reviewed. Exam conducted with a chaperone present.  Constitutional:      Appearance: Normal appearance.  HENT:     Head: Normocephalic and atraumatic.  Cardiovascular:     Rate and Rhythm: Normal rate.  Pulmonary:     Effort: Pulmonary effort is normal.  Chest:  Breasts:    Right: Normal.     Left: Normal.  Abdominal:     General: Abdomen is flat.     Palpations: Abdomen is soft.  Genitourinary:    General: Normal vulva.     Exam position: Lithotomy position.     Vagina: Normal.     Cervix: Normal.     Uterus: Normal.      Adnexa: Right adnexa normal and left adnexa normal.  Musculoskeletal:     Cervical back: Normal range of motion.  Skin:    General: Skin is warm and dry.  Neurological:     General: No focal deficit present.     Mental Status: She is alert.  Psychiatric:  Mood and Affect: Mood normal.        Behavior: Behavior normal.     Data Reviewed    Assessment Pap 09/2020 HSIL  Plan Meds ordered this encounter  Medications   naproxen (NAPROSYN) 500 MG tablet    Sig: Take 1 tablet (500 mg total) by mouth every 12 (twelve) hours as needed for mild pain or moderate pain.    Dispense:  30 tablet    Refill:  6       RTC 12 mo for pap if nl, colpo for abnormality    Emeterio Reeve 03/26/2022, 8:37 AM

## 2022-03-27 LAB — CERVICOVAGINAL ANCILLARY ONLY
Bacterial Vaginitis (gardnerella): NEGATIVE
Chlamydia: NEGATIVE
Comment: NEGATIVE
Comment: NEGATIVE
Comment: NEGATIVE
Comment: NORMAL
Neisseria Gonorrhea: NEGATIVE
Trichomonas: NEGATIVE

## 2022-03-28 LAB — CYTOLOGY - PAP
Diagnosis: NEGATIVE
Diagnosis: REACTIVE

## 2022-09-18 ENCOUNTER — Other Ambulatory Visit: Payer: Self-pay

## 2022-09-18 ENCOUNTER — Emergency Department (HOSPITAL_COMMUNITY)
Admission: EM | Admit: 2022-09-18 | Discharge: 2022-09-19 | Discharge status: Against Medical Advice | Payer: Medicaid Other | Attending: Student | Admitting: Student

## 2022-09-18 DIAGNOSIS — N898 Other specified noninflammatory disorders of vagina: Secondary | ICD-10-CM | POA: Diagnosis not present

## 2022-09-18 DIAGNOSIS — L292 Pruritus vulvae: Secondary | ICD-10-CM | POA: Diagnosis not present

## 2022-09-18 DIAGNOSIS — Z5321 Procedure and treatment not carried out due to patient leaving prior to being seen by health care provider: Secondary | ICD-10-CM | POA: Insufficient documentation

## 2022-09-18 LAB — WET PREP, GENITAL
Clue Cells Wet Prep HPF POC: NONE SEEN
Sperm: NONE SEEN
Trich, Wet Prep: NONE SEEN
WBC, Wet Prep HPF POC: <10 (ref <10)

## 2022-09-18 NOTE — ED Provider Triage Note (Signed)
Emergency Medicine Provider Triage Evaluation Note  Katrina Cain , a 26 y.o. female  was evaluated in triage.  Pt complains of vaginal irritation.  Started yesterday after being on probiotics for a few days.  She is currently using a Monistat which is making it worse.  Sexually active in a monogamous relationship, no vaginal discharge that she has noticed..  Review of Systems  Per HPI  Physical Exam  BP 126/84   Pulse 82   Temp 98.3 F (36.8 C) (Oral)   SpO2 100%  Gen:   Awake, no distress   Resp:  Normal effort  MSK:   Moves extremities without difficulty  Other:  Pelvic deferred   Medical Decision Making  Medically screening exam initiated at 10:37 PM.  Appropriate orders placed.  Katrina Cain was informed that the remainder of the evaluation will be completed by another provider, this initial triage assessment does not replace that evaluation, and the importance of remaining in the ED until their evaluation is complete.  Self swab    Theron Arista, [place] 09/18/22 2238

## 2022-09-18 NOTE — ED Triage Notes (Signed)
Patient coming to ED for evaluation of vaginal itching.  Reports starting a new Probiotic on Monday.  Has used Monstat OTC for symptoms without improvement.

## 2022-09-19 DIAGNOSIS — B3731 Acute candidiasis of vulva and vagina: Secondary | ICD-10-CM | POA: Diagnosis not present

## 2022-09-19 LAB — GC/CHLAMYDIA PROBE AMP (~~LOC~~) NOT AT ARMC
Chlamydia: NEGATIVE
Comment: NEGATIVE
Comment: NORMAL
Neisseria Gonorrhea: NEGATIVE

## 2022-10-21 ENCOUNTER — Emergency Department (HOSPITAL_BASED_OUTPATIENT_CLINIC_OR_DEPARTMENT_OTHER): Payer: Medicaid Other

## 2022-10-21 ENCOUNTER — Emergency Department (HOSPITAL_BASED_OUTPATIENT_CLINIC_OR_DEPARTMENT_OTHER)
Admission: EM | Admit: 2022-10-21 | Discharge: 2022-10-21 | Disposition: A | Discharge status: Home / Self Care | Payer: Medicaid Other | Attending: Emergency Medicine | Admitting: Emergency Medicine

## 2022-10-21 ENCOUNTER — Other Ambulatory Visit: Payer: Self-pay

## 2022-10-21 ENCOUNTER — Encounter (HOSPITAL_BASED_OUTPATIENT_CLINIC_OR_DEPARTMENT_OTHER): Payer: Self-pay | Admitting: *Deleted

## 2022-10-21 DIAGNOSIS — R1013 Epigastric pain: Secondary | ICD-10-CM | POA: Diagnosis not present

## 2022-10-21 DIAGNOSIS — R112 Nausea with vomiting, unspecified: Secondary | ICD-10-CM | POA: Diagnosis not present

## 2022-10-21 DIAGNOSIS — R1084 Generalized abdominal pain: Secondary | ICD-10-CM | POA: Diagnosis not present

## 2022-10-21 DIAGNOSIS — Z1152 Encounter for screening for COVID-19: Secondary | ICD-10-CM | POA: Diagnosis not present

## 2022-10-21 DIAGNOSIS — R197 Diarrhea, unspecified: Secondary | ICD-10-CM | POA: Insufficient documentation

## 2022-10-21 DIAGNOSIS — R110 Nausea: Secondary | ICD-10-CM | POA: Diagnosis not present

## 2022-10-21 LAB — URINALYSIS, ROUTINE W REFLEX MICROSCOPIC
Bacteria, UA: NONE SEEN
Bilirubin Urine: NEGATIVE
Glucose, UA: NEGATIVE mg/dL
Hgb urine dipstick: NEGATIVE
Ketones, ur: 15 mg/dL — AB
Leukocytes,Ua: NEGATIVE
Nitrite: NEGATIVE
Protein, ur: 30 mg/dL — AB
Specific Gravity, Urine: 1.032 — ABNORMAL HIGH (ref 1.005–1.030)
pH: 7.5 (ref 5.0–8.0)

## 2022-10-21 LAB — COMPREHENSIVE METABOLIC PANEL
ALT: 10 U/L (ref 0–44)
AST: 12 U/L — ABNORMAL LOW (ref 15–41)
Albumin: 4.4 g/dL (ref 3.5–5.0)
Alkaline Phosphatase: 55 U/L (ref 38–126)
Anion gap: 10 (ref 5–15)
BUN: 12 mg/dL (ref 6–20)
CO2: 24 mmol/L (ref 22–32)
Calcium: 9.1 mg/dL (ref 8.9–10.3)
Chloride: 104 mmol/L (ref 98–111)
Creatinine, Ser: 0.63 mg/dL (ref 0.44–1.00)
GFR, Estimated: >60 mL/min (ref >60)
Glucose, Bld: 93 mg/dL (ref 70–99)
Potassium: 3.6 mmol/L (ref 3.5–5.1)
Sodium: 138 mmol/L (ref 135–145)
Total Bilirubin: 0.5 mg/dL (ref 0.3–1.2)
Total Protein: 7.3 g/dL (ref 6.5–8.1)

## 2022-10-21 LAB — CBC
HCT: 42.6 % (ref 36.0–46.0)
Hemoglobin: 14.3 g/dL (ref 12.0–15.0)
MCH: 30.2 pg (ref 26.0–34.0)
MCHC: 33.6 g/dL (ref 30.0–36.0)
MCV: 90.1 fL (ref 80.0–100.0)
Platelets: 246 10*3/uL (ref 150–400)
RBC: 4.73 MIL/uL (ref 3.87–5.11)
RDW: 13.7 % (ref 11.5–15.5)
WBC: 6 10*3/uL (ref 4.0–10.5)
nRBC: 0 % (ref 0.0–0.2)

## 2022-10-21 LAB — RESP PANEL BY RT-PCR (RSV, FLU A&B, COVID)  RVPGX2
Influenza A by PCR: NEGATIVE
Influenza B by PCR: NEGATIVE
Resp Syncytial Virus by PCR: NEGATIVE
SARS Coronavirus 2 by RT PCR: NEGATIVE

## 2022-10-21 LAB — PREGNANCY, URINE: Preg Test, Ur: NEGATIVE

## 2022-10-21 LAB — LIPASE, BLOOD: Lipase: <10 U/L — ABNORMAL LOW (ref 11–51)

## 2022-10-21 MED ORDER — ONDANSETRON HCL 4 MG PO TABS: 4.0000 mg | ORAL_TABLET | Freq: Three times a day (TID) | ORAL | 0 refills | Status: DC | PRN

## 2022-10-21 MED ORDER — SODIUM CHLORIDE 0.9 % IV BOLUS
1000.0000 mL | Freq: Once | INTRAVENOUS | Status: AC
  Administered 2022-10-21: 1000 mL via INTRAVENOUS

## 2022-10-21 MED ORDER — KETOROLAC TROMETHAMINE 30 MG/ML IJ SOLN
30.0000 mg | Freq: Once | INTRAMUSCULAR | Status: AC
  Administered 2022-10-21: 30 mg via INTRAVENOUS
  Filled 2022-10-21: qty 1

## 2022-10-21 MED ORDER — ONDANSETRON HCL 4 MG/2ML IJ SOLN
4.0000 mg | Freq: Once | INTRAMUSCULAR | Status: AC | PRN
  Administered 2022-10-21: 4 mg via INTRAVENOUS
  Filled 2022-10-21: qty 2

## 2022-10-21 NOTE — ED Notes (Signed)
Fluid challenge - passed... Provider aware.Marland KitchenMarland Kitchen

## 2022-10-21 NOTE — Discharge Instructions (Addendum)
You have been seen and discharged from the emergency department.  Your blood work showed dehydration and ultrasound showed no abnormality with the gallbladder.  Take prescription as directed.  Stable hydrated, follow a bland diet until feeling better.  Follow-up with your primary provider for further evaluation and further care. Take home medications as prescribed. If you have any worsening symptoms or further concerns for your health please return to an emergency department for further evaluation.

## 2022-10-21 NOTE — ED Notes (Signed)
Discharge paperwork given and verbally understood. 

## 2022-10-21 NOTE — ED Triage Notes (Addendum)
Pt is here from home by ems due to nausea and vomiting and abdominal pain since 10pm last night.  Pt reports that she has vomited 5-6x.  Pt had watery diarrhea last pm and this am. Pt reports that her daughter is also having vomiting and diarrhea. Pt reports fever and chills and was feeling achy last pm

## 2022-10-21 NOTE — ED Provider Notes (Signed)
[facility] EMERGENCY DEPT Provider Note   CSN: [id number] Arrival date & time: 10/21/22  0272     History  Chief Complaint  Patient presents with   Emesis    Katrina Cain is a 27 y.o. female.  27 year old female with past medical history of asthma and GERD presents emergency department with concern for upper abdominal pain, nausea/vomiting/diarrhea.  Patient states that started yesterday evening.  Emesis and diarrhea has been nonbloody.  No documented fever, she endorses chills.  She states the epigastric pain is starting to radiate around to her upper back.  Denies any chest pain or shortness of breath.  No genital urinary symptoms.       Home Medications Prior to Admission medications   Medication Sig Start Date End Date Taking? Authorizing Provider  naproxen (NAPROSYN) 500 MG tablet Take 1 tablet (500 mg total) by mouth every 12 (twelve) hours as needed for mild pain or moderate pain. 03/26/22   Adam Phenix, MD  Prenat-Fe Poly-Methfol-FA-DHA (VITAFOL ULTRA) 29-0.6-0.4-200 MG CAPS Take 1 capsule by mouth daily before breakfast. Patient not taking: Reported on 03/26/2022 08/01/21   Brock Bad, MD  promethazine (PHENERGAN) 25 MG tablet Take 1 tablet (25 mg total) by mouth every 6 (six) hours as needed for nausea or vomiting. 02/27/19 03/30/20  Antony Madura, PA-C      Allergies    Patient has no known allergies.    Review of Systems   Review of Systems  Constitutional:  Positive for chills and fatigue. Negative for fever.  Respiratory:  Negative for shortness of breath.   Cardiovascular:  Negative for chest pain.  Gastrointestinal:  Positive for abdominal pain, diarrhea, nausea and vomiting. Negative for abdominal distention, blood in stool and rectal pain.  Genitourinary:  Negative for dysuria, flank pain, hematuria and vaginal bleeding.  Skin:  Negative for rash.  Neurological:  Negative for headaches.    Physical Exam Updated Vital Signs BP  111/64 (BP Location: Right Arm)   Pulse 76   Temp 97.7 F (36.5 C) (Oral)   Resp 18   Wt 79.4 kg   LMP 09/17/2022 (Approximate)   SpO2 100%   Breastfeeding No   BMI 26.61 kg/m  Physical Exam Vitals and nursing note reviewed.  Constitutional:      Appearance: Normal appearance.  HENT:     Head: Normocephalic.     Mouth/Throat:     Mouth: Mucous membranes are moist.  Cardiovascular:     Rate and Rhythm: Normal rate.  Pulmonary:     Effort: Pulmonary effort is normal. No respiratory distress.  Abdominal:     General: Bowel sounds are normal.     Palpations: Abdomen is soft.     Tenderness: There is abdominal tenderness. There is rebound. There is no guarding.  Skin:    General: Skin is warm.  Neurological:     Mental Status: She is alert and oriented to person, place, and time. Mental status is at baseline.  Psychiatric:        Mood and Affect: Mood normal.     ED Results / Procedures / Treatments   Labs (all labs ordered are listed, but only abnormal results are displayed) Labs Reviewed  URINALYSIS, ROUTINE W REFLEX MICROSCOPIC - Abnormal; Notable for the following components:      Result Value   Specific Gravity, Urine 1.032 (*)    Ketones, ur 15 (*)    Protein, ur 30 (*)    All other components within  normal limits  RESP PANEL BY RT-PCR (RSV, FLU A&B, COVID)  RVPGX2  CBC  PREGNANCY, URINE  LIPASE, BLOOD  COMPREHENSIVE METABOLIC PANEL    EKG None  Radiology No results found.  Procedures Procedures    Medications Ordered in ED Medications  ondansetron (ZOFRAN) injection 4 mg (has no administration in time range)  sodium chloride 0.9 % bolus 1,000 mL (has no administration in time range)  ketorolac (TORADOL) 30 MG/ML injection 30 mg (has no administration in time range)    ED Course/ Medical Decision Making/ A&P                           Medical Decision Making Amount and/or Complexity of Data Reviewed Labs: ordered. Radiology:  ordered.  Risk Prescription drug management.   27 year old female presents emergency department nausea vomiting and diarrhea.  Daughter has similar symptoms.  Patient states that she felt febrile with chills last night.  Complains of epigastric abdominal pain but benign on exam.  Vitals are normal and stable.  Blood work is reassuring, urinalysis shows slight dehydration.  No other acute abnormalities.  Ultrasound shows no acute findings in regards to gallbladder.  After IV fluids and treatment patient feels improved, passed her p.o. test.  Plan for symptomatic treatment and outpatient follow-up.  DC        Final Clinical Impression(s) / ED Diagnoses Final diagnoses:  None    Rx / DC Orders ED Discharge Orders     None         Lorelle Gibbs, DO 10/21/22 1151

## 2023-02-05 IMAGING — US US OB LIMITED
1 series · 5 of 5 positions shown · non-contrast
Comparison: none

[Series 1: us ob limited · 5 of 5 slices shown]
[im 1/5]
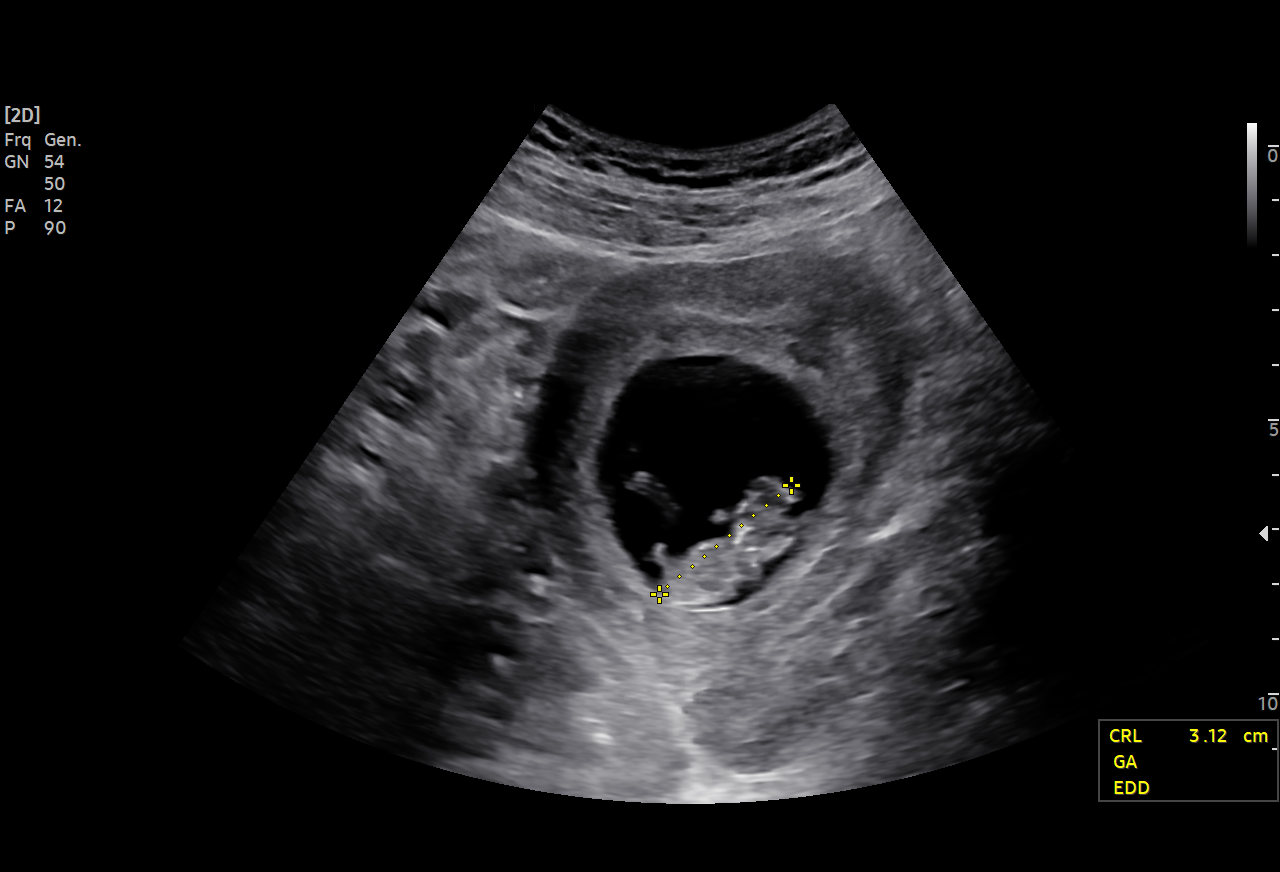
[im 2/5]
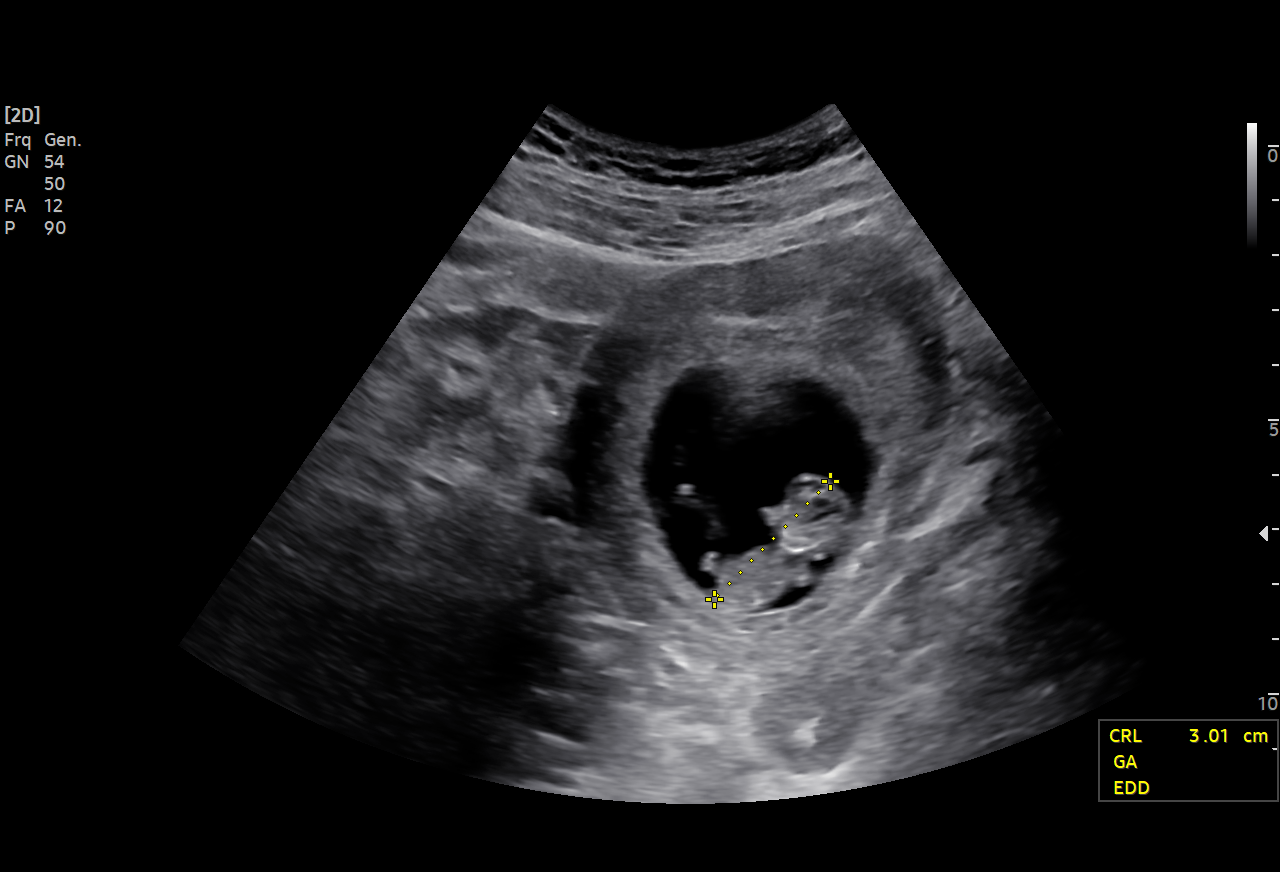
[im 3/5]
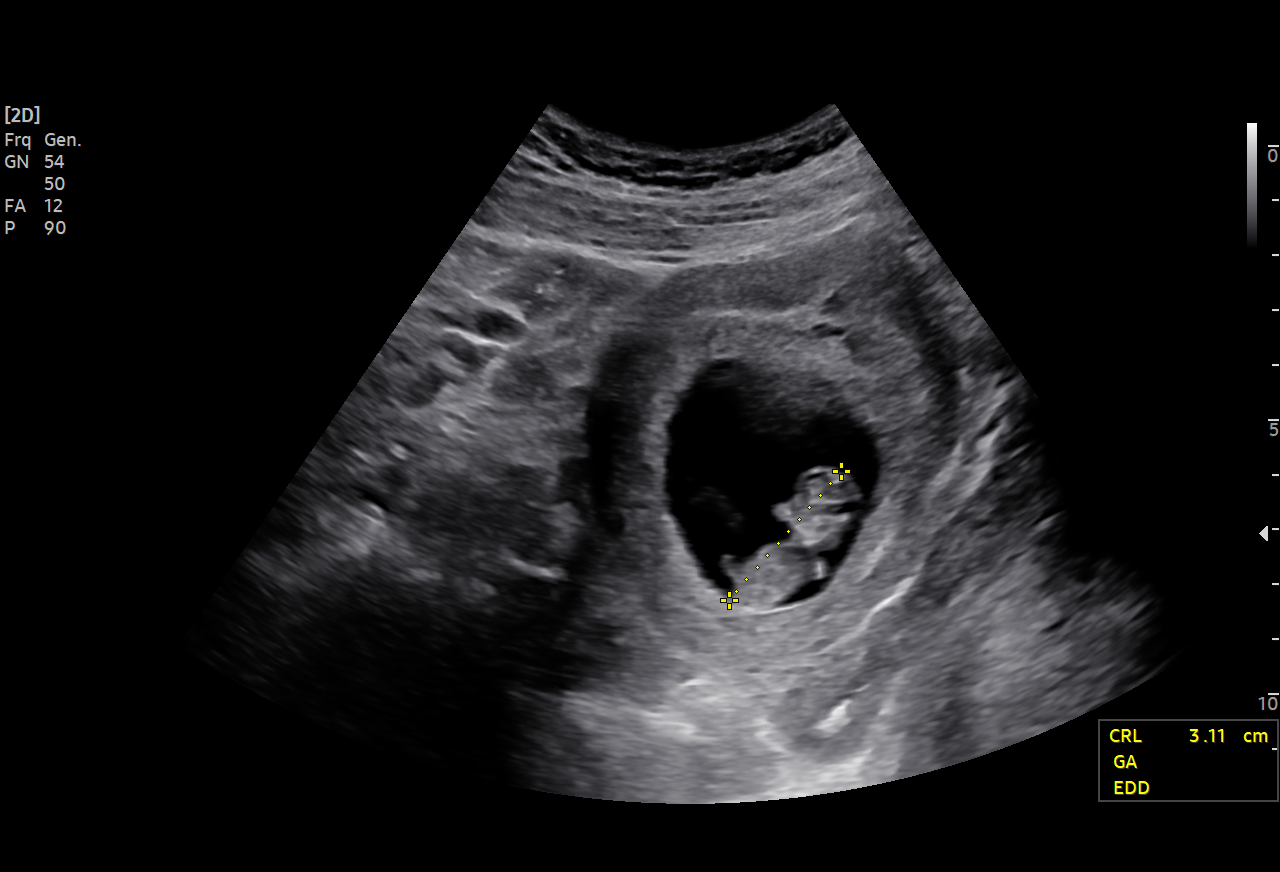
[im 4/5]
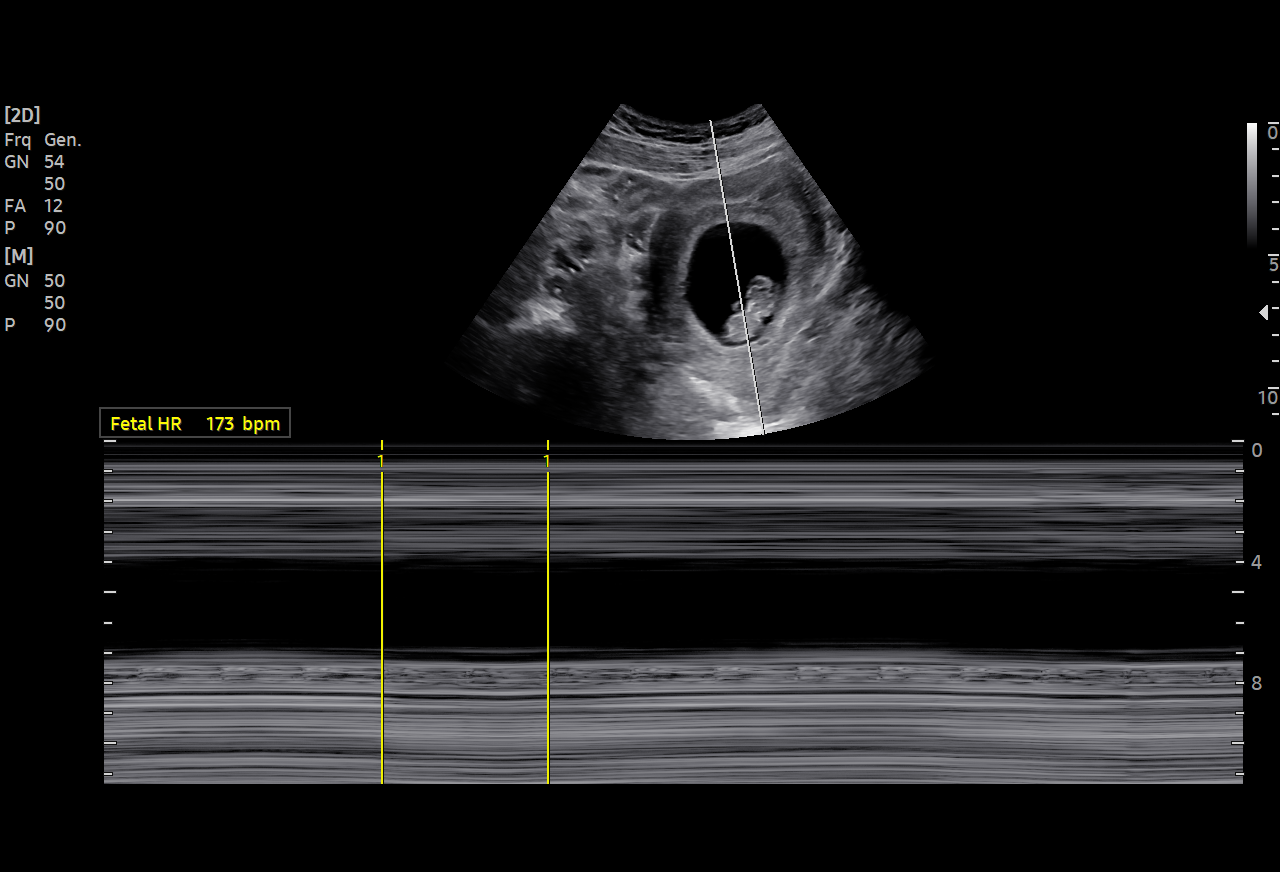
[im 5/5]
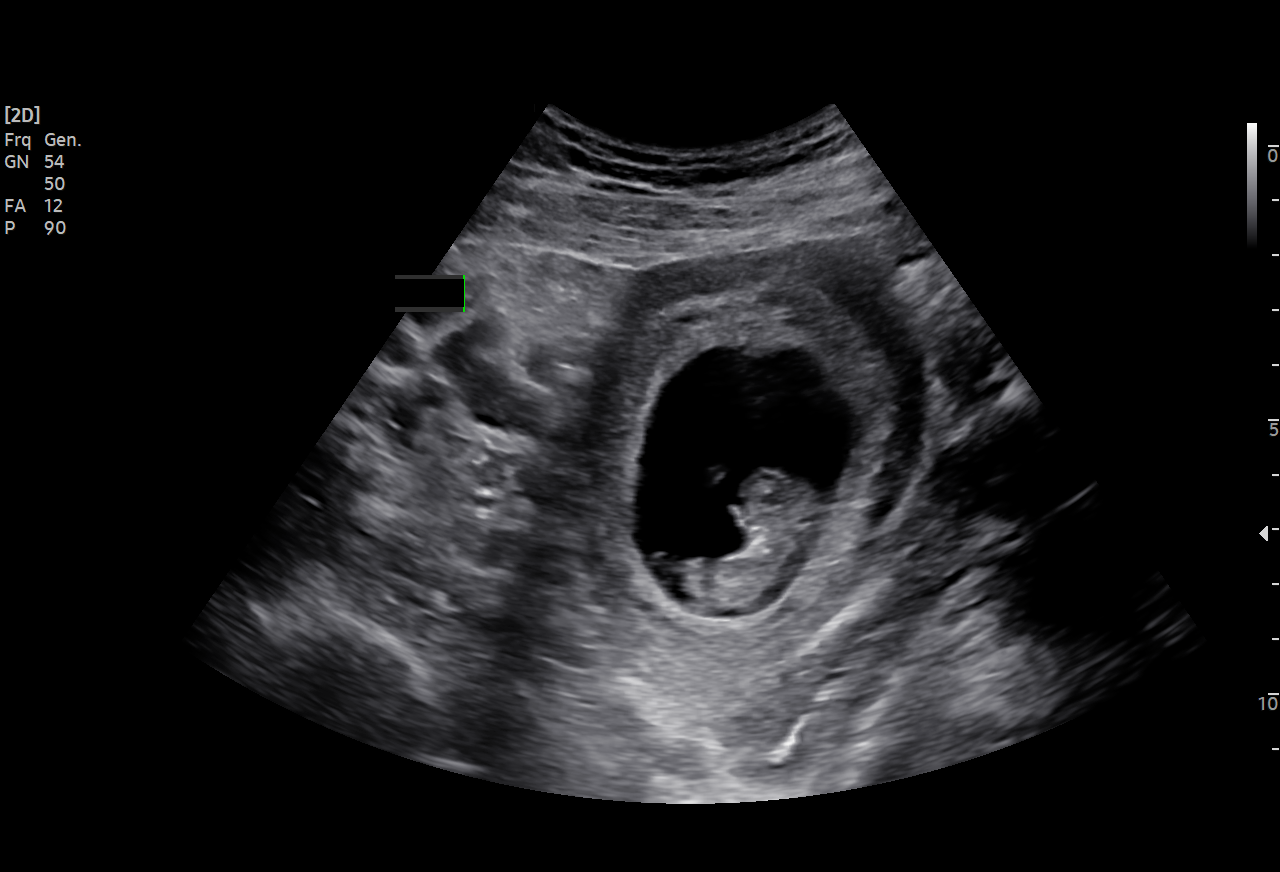

[5 of 5 positions shown; findings below may reference images not displayed]

[REDACTED]care

 1  [HOSPITAL]                         76815.0     LETUMILE SANDE

Indications

 9 weeks gestation of pregnancy
Fetal Evaluation

 Num Of Fetuses:         1
 Fetal Heart Rate(bpm):  173
 Cardiac Activity:       Observed
Biometry

 CRL:      30.8  mm     G. Age:  9w 6d                   EDD:   08/16/21
Gestational Age

 Best:          9w 6d      Det. By:  U/S C R L (01/17/21)     EDD:   08/16/21
Comments

 Single live IUP at 9w6d by CRL. LMP 11/07/20
Impression

 Single IUP 9w6d by CRL
 + cardiac activity
Recommendations

 F/U OB U/S as clinically indicated

## 2023-03-07 DIAGNOSIS — R5381 Other malaise: Secondary | ICD-10-CM | POA: Diagnosis not present

## 2023-03-07 DIAGNOSIS — E876 Hypokalemia: Secondary | ICD-10-CM | POA: Diagnosis not present

## 2023-05-07 IMAGING — US US MFM OB FOLLOW-UP
1 series · 14 of 28 positions shown · non-contrast
Comparison: none

[Series 1: us mfm ob follow-up · 62 acquisitions, 14 frames shown]
[im 3/62]
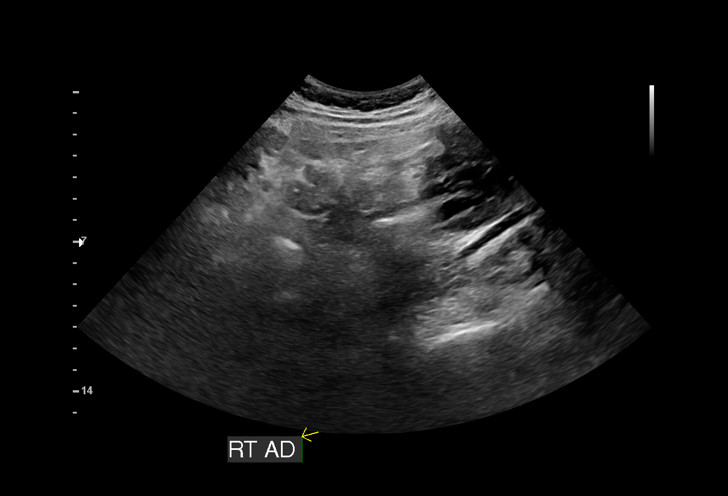
[im 7/62]
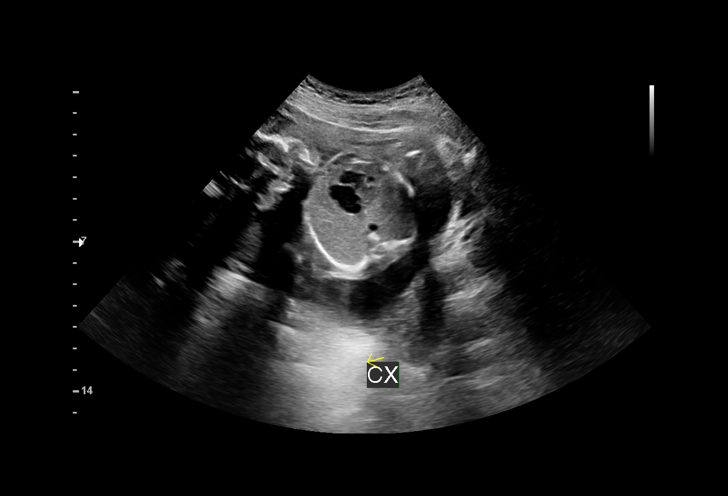
[im 12/62]
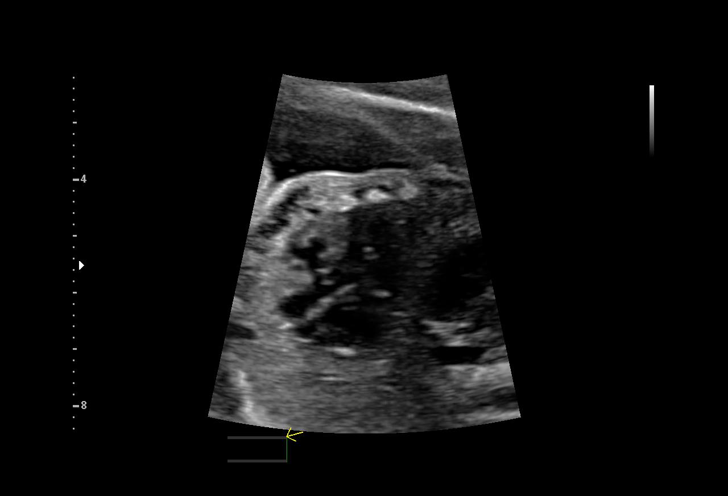
[im 16/62]
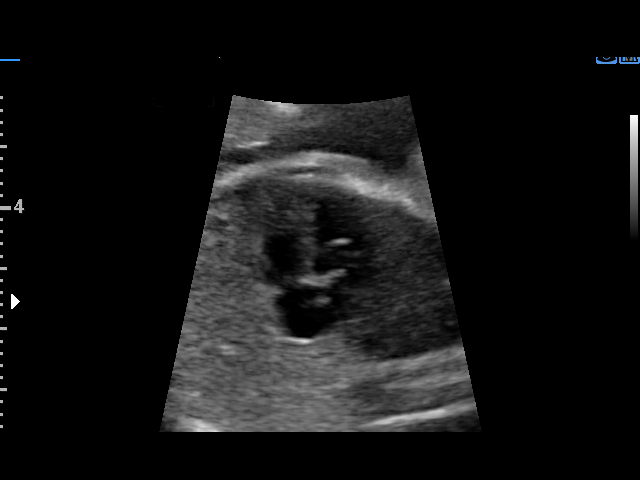
[im 21/62]
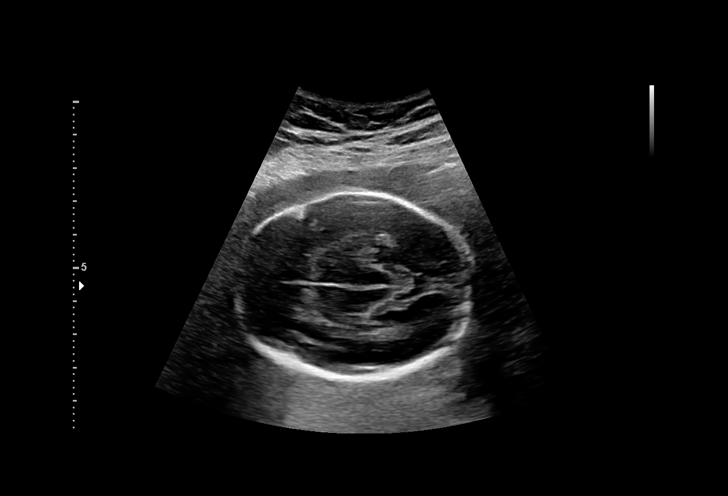
[im 25/62]
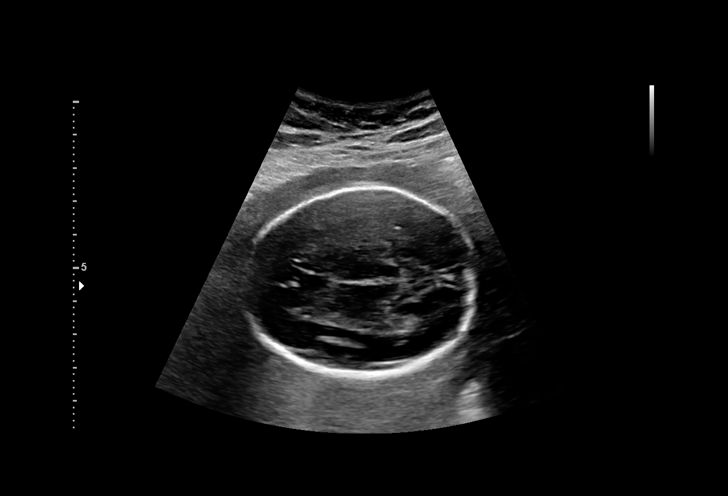
[im 30/62]
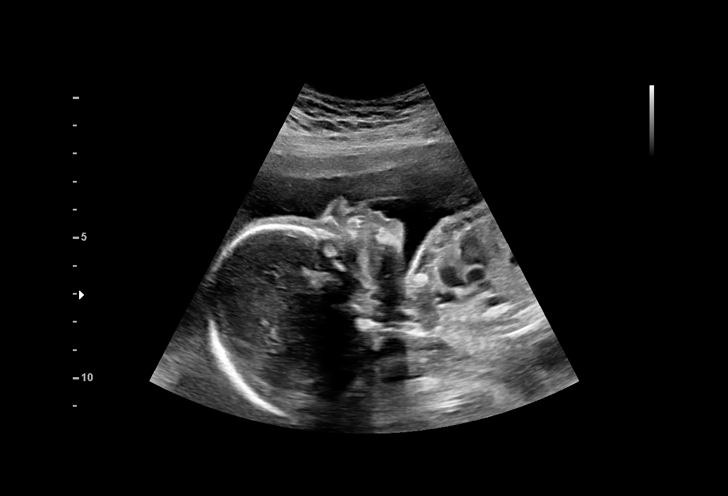
[im 34/62]
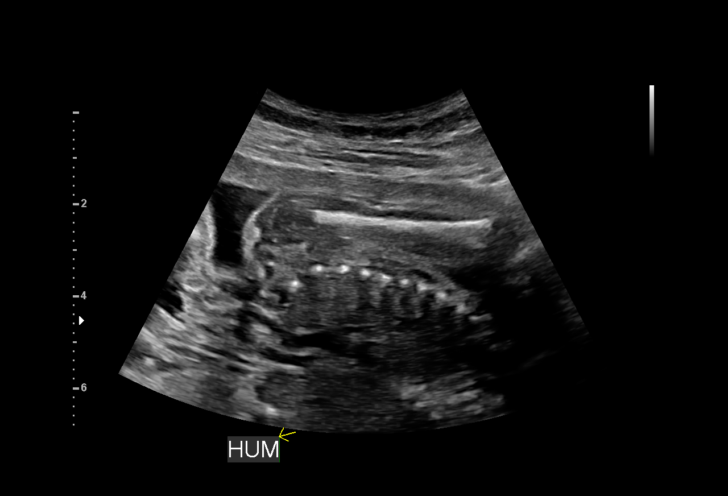
[im 39/62]
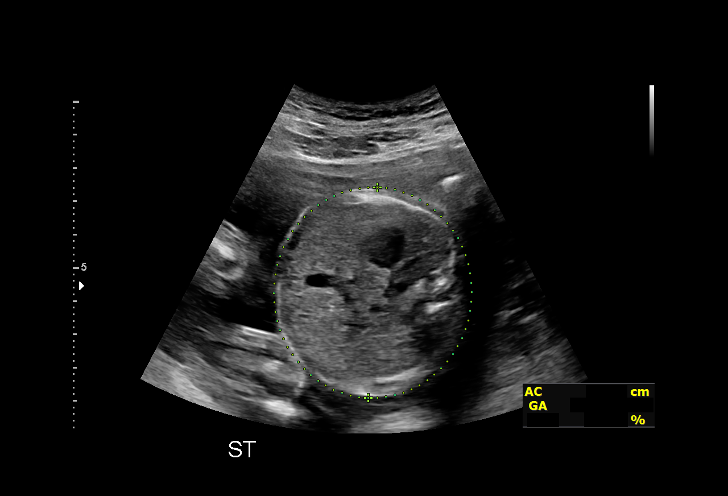
[im 43/62]
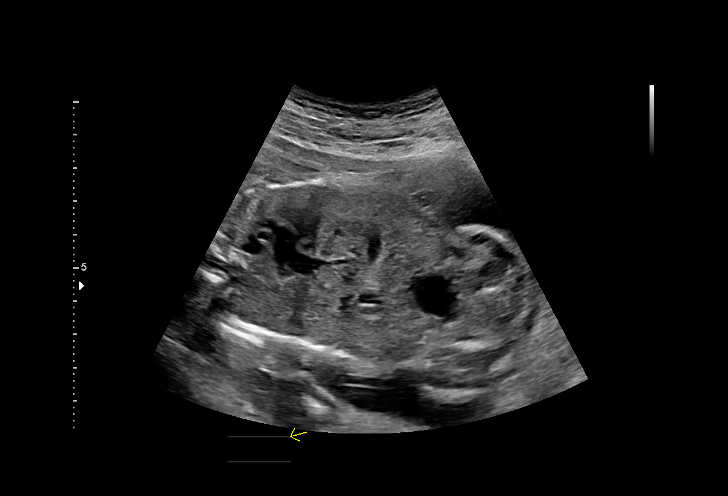
[im 48/62]
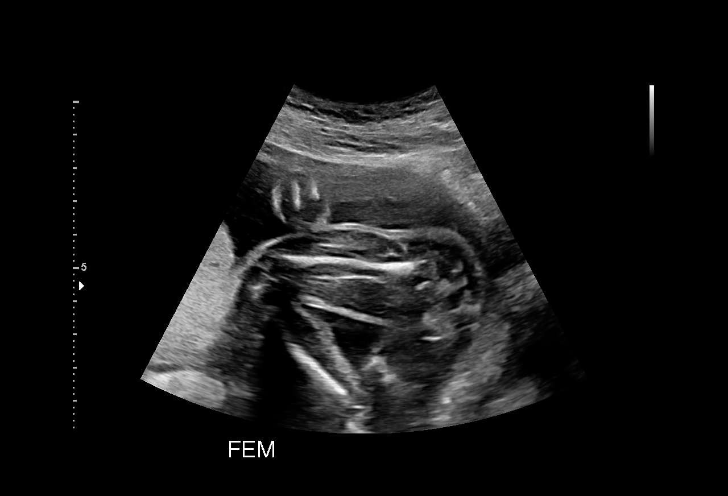
[im 52/62]
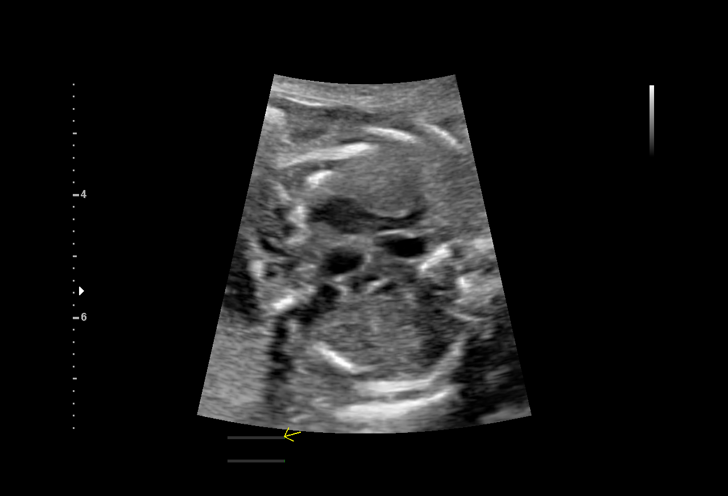
[im 57/62]
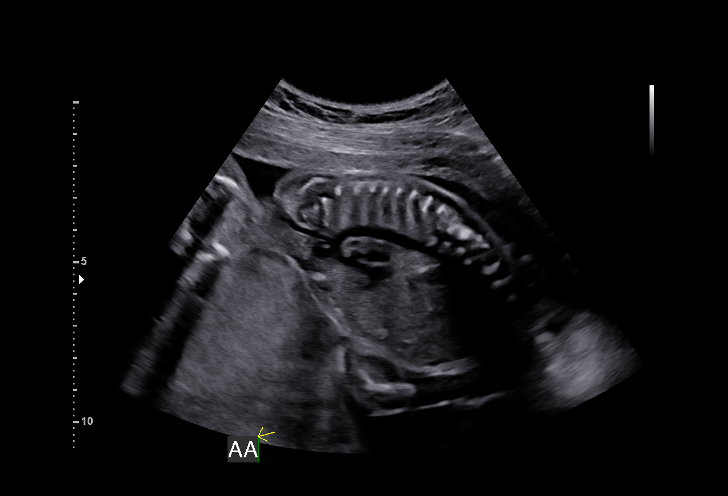
[im 62/62]
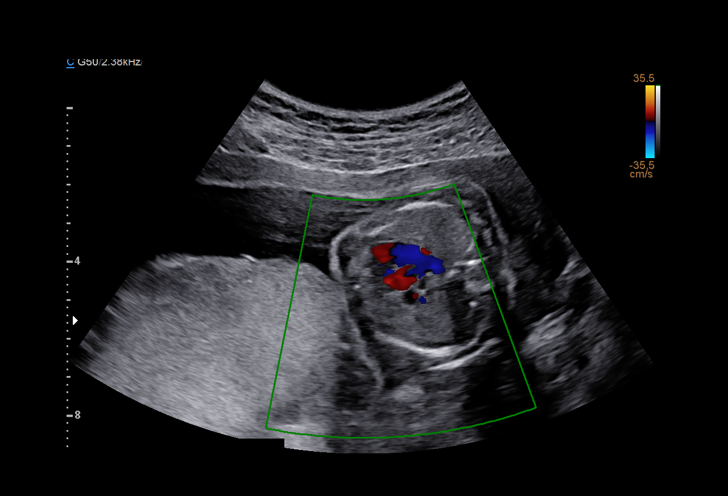

[14 of 28 positions shown; findings below may reference images not displayed]

[id number]

                                                      YAHMDI

Indications

 22 weeks gestation of pregnancy
 Low Risk NIPS(Negative Horizon
 [DATE])(Negative AFP)
 Echogenic intracardiac focus of the heart
 (EIF)
 Antenatal follow-up for nonvisualized fetal
 anatomy
Fetal Evaluation

 Num Of Fetuses:         1
 Preg. Location:         Intrauterine
 Fetal Heart Rate(bpm):  167
 Cardiac Activity:       Observed
 Presentation:           Breech
 Placenta:               Posterior Fundal

 Amniotic Fluid
 AFI FV:      Within normal limits

                             Largest Pocket(cm)

Biometry

 BPD:      55.8  mm     G. Age:  23w 0d         52  %    CI:        73.93   %    70 - 86
                                                         FL/HC:      19.4   %    19.2 -
 HC:      206.1  mm     G. Age:  22w 5d         30  %    HC/AC:      1.04        1.05 -
 AC:      198.6  mm     G. Age:  24w 4d         88  %    FL/BPD:     71.7   %    71 - 87
 FL:         40  mm     G. Age:  22w 6d         40  %    FL/AC:      20.1   %    20 - 24
 HUM:      39.7  mm     G. Age:  24w 1d         74  %
 LV:        4.6  mm

 Est. FW:     617  gm      1 lb 6 oz     81  %
OB History

 Gravidity:    1
Gestational Age

 LMP:           23w 1d        Date:  11/07/20                 EDD:   08/14/21
 U/S Today:     23w 2d                                        EDD:   08/13/21
 Best:          22w 6d     Det. By:  U/S C R L  (01/17/21)    EDD:   08/16/21
Anatomy

 Cranium:               Appears normal         Aortic Arch:            Appears normal
 Cavum:                 Appears normal         Ductal Arch:            Previously seen
 Ventricles:            Appears normal         Diaphragm:              Appears normal
 Choroid Plexus:        Previously seen        Stomach:                Appears normal, left
                                                                       sided
 Cerebellum:            Previously seen        Abdomen:                Appears normal
 Posterior Fossa:       Previously seen        Abdominal Wall:         Previously seen
 Nuchal Fold:           Previously seen        Cord Vessels:           Previously seen
 Face:                  Profile appears        Kidneys:                Previously seen
                        normal
 Lips:                  Previously seen        Bladder:                Appears normal
 Thoracic:              Appears normal         Spine:                  Previously seen
 Heart:                 Appears normal         Upper Extremities:      Previously seen
 RVOT:                  Appears normal         Lower Extremities:      Visualized
 LVOT:                  Appears normal

 Other:  Fetus appears to be female. Hands previously visualized. Technically
         difficult due to fetal position. 3VV and 3VTV visualized.
Impression

 Patient returned for completion of fetal anatomy .Amniotic
 fluid is normal and good fetal activity is seen .Fetal biometry
 is consistent with her previously-established dates .Fetal
 anatomical survey was completed and appears normal.
Recommendations

 Follow-up scans as clinically indicated.
                 Ayyad, Gayathri

## 2023-09-09 DIAGNOSIS — Z3202 Encounter for pregnancy test, result negative: Secondary | ICD-10-CM | POA: Diagnosis not present

## 2023-09-09 DIAGNOSIS — N760 Acute vaginitis: Secondary | ICD-10-CM | POA: Diagnosis not present

## 2023-09-09 DIAGNOSIS — N898 Other specified noninflammatory disorders of vagina: Secondary | ICD-10-CM | POA: Diagnosis not present

## 2023-09-09 DIAGNOSIS — Z113 Encounter for screening for infections with a predominantly sexual mode of transmission: Secondary | ICD-10-CM | POA: Diagnosis not present

## 2023-09-09 DIAGNOSIS — B3731 Acute candidiasis of vulva and vagina: Secondary | ICD-10-CM | POA: Diagnosis not present

## 2023-10-07 DIAGNOSIS — B3731 Acute candidiasis of vulva and vagina: Secondary | ICD-10-CM | POA: Diagnosis not present

## 2023-10-07 DIAGNOSIS — Z113 Encounter for screening for infections with a predominantly sexual mode of transmission: Secondary | ICD-10-CM | POA: Diagnosis not present

## 2024-02-05 ENCOUNTER — Ambulatory Visit: Admitting: Obstetrics and Gynecology

## 2024-02-17 ENCOUNTER — Ambulatory Visit: Admitting: Obstetrics & Gynecology

## 2024-02-18 DIAGNOSIS — M25562 Pain in left knee: Secondary | ICD-10-CM | POA: Diagnosis not present

## 2024-02-18 DIAGNOSIS — S8392XA Sprain of unspecified site of left knee, initial encounter: Secondary | ICD-10-CM | POA: Diagnosis not present

## 2024-02-18 DIAGNOSIS — X58XXXA Exposure to other specified factors, initial encounter: Secondary | ICD-10-CM | POA: Diagnosis not present

## 2024-02-24 ENCOUNTER — Ambulatory Visit: Admitting: Obstetrics & Gynecology

## 2024-03-23 ENCOUNTER — Ambulatory Visit: Admitting: Obstetrics and Gynecology

## 2024-03-23 VITALS — BP 117/80 | HR 92 | Ht 68.0 in | Wt 168.6 lb

## 2024-03-23 DIAGNOSIS — Z3046 Encounter for surveillance of implantable subdermal contraceptive: Secondary | ICD-10-CM | POA: Diagnosis not present

## 2024-03-23 NOTE — Progress Notes (Signed)
 Pt presents for nexplanon  removal. Pt has no questions or concerns at this time.

## 2024-03-23 NOTE — Progress Notes (Signed)
     GYNECOLOGY OFFICE PROCEDURE NOTE  Katrina Cain is a 28 y.o. G1P0101 here for Nexplanon  removal 03/26/22 concerns.  Nexplanon  Removal Patient identified, informed consent performed, consent signed.   Appropriate time out taken.   Nexplanon  site identified.  Area prepped in usual sterile fashon. One ml of 1% lidocaine  was used to anesthetize the area at the distal end of the implant. A small stab incision was made right beside the implant on the distal portion.  The Nexplanon  rod was grasped using hemostats and removed without difficulty.  There was minimal blood loss. There were no complications.  3 ml of 1% lidocaine  was injected around the incision for post-procedure analgesia.  Steri-strips were applied over the small incision.  A pressure bandage was applied to reduce any bruising.    The patient tolerated the procedure well and was given post procedure instructions.  Patient is planning to "use depo for contraception. Would like to come back later this week or next week to get it  Return in 2-3 month for annual  [name], FNP Center for [employer], [facility]

## 2024-03-30 ENCOUNTER — Ambulatory Visit

## 2024-04-09 DIAGNOSIS — R300 Dysuria: Secondary | ICD-10-CM | POA: Diagnosis not present

## 2024-04-09 DIAGNOSIS — Z3202 Encounter for pregnancy test, result negative: Secondary | ICD-10-CM | POA: Diagnosis not present

## 2024-04-09 DIAGNOSIS — N9089 Other specified noninflammatory disorders of vulva and perineum: Secondary | ICD-10-CM | POA: Diagnosis not present

## 2024-04-09 DIAGNOSIS — Z113 Encounter for screening for infections with a predominantly sexual mode of transmission: Secondary | ICD-10-CM | POA: Diagnosis not present

## 2024-04-11 NOTE — Result Encounter Note (Signed)
 Vaginitis swab positive for yeast infection. Pt was treated with Diflucan . No further treatment needed. REJ

## 2024-05-06 ENCOUNTER — Ambulatory Visit: Admitting: Obstetrics

## 2024-05-10 ENCOUNTER — Other Ambulatory Visit (HOSPITAL_COMMUNITY)
Admission: RE | Admit: 2024-05-10 | Discharge: 2024-05-10 | Disposition: A | Discharge status: Home / Self Care | Source: Ambulatory Visit | Attending: Obstetrics & Gynecology | Admitting: Obstetrics & Gynecology

## 2024-05-10 ENCOUNTER — Ambulatory Visit: Admitting: Obstetrics & Gynecology

## 2024-05-10 ENCOUNTER — Encounter: Payer: Self-pay | Admitting: Obstetrics & Gynecology

## 2024-05-10 VITALS — BP 114/76 | HR 67 | Ht 68.0 in | Wt 169.2 lb

## 2024-05-10 DIAGNOSIS — Z01419 Encounter for gynecological examination (general) (routine) without abnormal findings: Secondary | ICD-10-CM | POA: Diagnosis not present

## 2024-05-10 DIAGNOSIS — R87613 High grade squamous intraepithelial lesion on cytologic smear of cervix (HGSIL): Secondary | ICD-10-CM | POA: Diagnosis not present

## 2024-05-10 DIAGNOSIS — Z1151 Encounter for screening for human papillomavirus (HPV): Secondary | ICD-10-CM | POA: Diagnosis not present

## 2024-05-10 DIAGNOSIS — Z113 Encounter for screening for infections with a predominantly sexual mode of transmission: Secondary | ICD-10-CM | POA: Diagnosis not present

## 2024-05-10 DIAGNOSIS — Z30011 Encounter for initial prescription of contraceptive pills: Secondary | ICD-10-CM | POA: Diagnosis not present

## 2024-05-10 DIAGNOSIS — Z124 Encounter for screening for malignant neoplasm of cervix: Secondary | ICD-10-CM | POA: Insufficient documentation

## 2024-05-10 LAB — POCT URINE PREGNANCY: Preg Test, Ur: NEGATIVE

## 2024-05-10 MED ORDER — LEVONORGEST-ETH ESTRAD 91-DAY 0.15-0.03 &0.01 MG PO TABS: 1.0000 | ORAL_TABLET | Freq: Every day | ORAL | 4 refills | Status: AC

## 2024-05-10 NOTE — Progress Notes (Signed)
 GYNECOLOGY CLINIC ANNUAL PREVENTATIVE CARE ENCOUNTER NOTE  Subjective:   Katrina Cain is a 28 y.o. G61P0101 female here for a routine annual gynecologic exam.  Current complaints: wants to restart extended-cycle OCP after d/c Nexplanon .   Denies abnormal vaginal bleeding, discharge, pelvic pain, problems with intercourse or other gynecologic concerns.   Needs f/u pap due to h/o HSIL Gynecologic History Patient's last menstrual period was 05/01/2024 (exact date). Contraception: OCP (estrogen/progesterone) Last Pap: 2023. Results were: normal Last mammogram: n/a.   Obstetric History OB History  Gravida Para Term Preterm AB Living  1 1 0 1 0 1  SAB IAB Ectopic Multiple Live Births  0 0 0 0 1    # Outcome Date GA Lbr Len/2nd Weight Sex Type Anes PTL Lv  1 Preterm 07/05/21 100w2d  3 lb 15.5 oz (1.8 kg) F CS-LTranv EPI  LIV    Past Medical History:  Diagnosis Date   Asthma    GERD (gastroesophageal reflux disease)    Vaginal Pap smear, abnormal     Past Surgical History:  Procedure Laterality Date   CESAREAN SECTION N/A 07/05/2021   Procedure: CESAREAN SECTION;  Surgeon: Kandis Devaughn Sayres, MD;  Location: [facility] LD ORS;  Service: Obstetrics;  Laterality: N/A;   NO PAST SURGERIES      Current Outpatient Medications on File Prior to Visit  Medication Sig Dispense Refill   naproxen  (NAPROSYN ) 500 MG tablet Take 1 tablet (500 mg total) by mouth every 12 (twelve) hours as needed for mild pain or moderate pain. (Patient not taking: Reported on 05/10/2024) 30 tablet 6   ondansetron  (ZOFRAN ) 4 MG tablet Take 1 tablet (4 mg total) by mouth every 8 (eight) hours as needed for nausea or vomiting. (Patient not taking: Reported on 05/10/2024) 4 tablet 0   Prenat-Fe Poly-Methfol-FA-DHA (VITAFOL  ULTRA) 29-0.6-0.4-200 MG CAPS Take 1 capsule by mouth daily before breakfast. (Patient not taking: Reported on 05/10/2024) 90 capsule 4   [DISCONTINUED] promethazine  (PHENERGAN ) 25 MG tablet Take 1 tablet (25  mg total) by mouth every 6 (six) hours as needed for nausea or vomiting. 12 tablet 0   No current facility-administered medications on file prior to visit.    No Known Allergies  Social History   Socioeconomic History   Marital status: Single    Spouse name: Not on file   Number of children: Not on file   Years of education: Not on file   Highest education level: Not on file  Occupational History   Not on file  Tobacco Use   Smoking status: Former    Current packs/day: 0.00    Types: Cigarettes    Quit date: 11/14/2020    Years since quitting: 3.4   Smokeless tobacco: Never  Vaping Use   Vaping status: Never Used  Substance and Sexual Activity   Alcohol use: No   Drug use: Not Currently    Types: Marijuana    Comment: Stopped when pregnancy confirmed   Sexual activity: Not Currently    Partners: Male    Birth control/protection: Implant, Injection  Other Topics Concern   Not on file  Social History Narrative   Not on file   Social Drivers of Health   Financial Resource Strain: Not on file  Food Insecurity: Not on file  Transportation Needs: Not on file  Physical Activity: Not on file  Stress: Not on file  Social Connections: Not on file  Intimate Partner Violence: Not on file    Family History  Problem Relation Age of Onset   Diabetes Mother    Hypertension Mother    Cancer Father    Cancer Paternal Grandmother     The following portions of the patient's history were reviewed and updated as appropriate: allergies, current medications, past family history, past medical history, past social history, past surgical history and problem list.  Review of Systems Pertinent items are noted in HPI.   Objective:  BP 114/76   Pulse 67   Ht 5' 8 (1.727 m)   Wt 169 lb 3.2 oz (76.7 kg)   LMP 05/01/2024 (Exact Date)   BMI 25.73 kg/m  CONSTITUTIONAL: Well-developed, well-nourished female in no acute distress.  HENT:  Normocephalic, atraumatic, External right and  left ear normal. Oropharynx is clear and moist EYES: Conjunctivae and EOM are normal. Pupils are equal, round, and reactive to light. No scleral icterus.  NECK: Normal range of motion, supple, no masses.  Normal thyroid.  SKIN: Skin is warm and dry. No rash noted. Not diaphoretic. No erythema. No pallor. NEUROLGIC: Alert and oriented to person, place, and time. Normal reflexes, muscle tone coordination. No cranial nerve deficit noted. PSYCHIATRIC: Normal mood and affect. Normal behavior. Normal judgment and thought content. CARDIOVASCULAR: Normal heart rate noted, regular rhythm RESPIRATORY:  Effort and breath sounds normal, no problems with respiration noted. ABDOMEN: Soft, normal bowel sounds, no distention noted.  No tenderness, rebound or guarding.  PELVIC: Normal appearing external genitalia; normal appearing vaginal mucosa and cervix.  No abnormal discharge noted.  Pap smear obtained.  Normal uterine size, no other palpable masses, no uterine or adnexal tenderness. MUSCULOSKELETAL: Normal range of motion. No tenderness.  No cyanosis, clubbing, or edema.    Assessment:  Annual gynecologic examination with pap smear  HSIL (high grade squamous intraepithelial lesion) on Pap smear of cervix  Well woman exam with routine gynecological exam - Plan: POCT urine pregnancy, Cervicovaginal ancillary only( [place]), HIV antibody (with reflex), RPR, Hepatitis C Antibody, Hepatitis B Surface AntiGEN, Cytology - PAP( [place]), CANCELED: Cytology - PAP( [place])  Oral contraception initiation - Plan: Levonorgestrel -Ethinyl Estradiol (AMETHIA) 0.15-0.03 &0.01 MG tablet  Plan:  Will follow up results of pap smear and manage accordingly. Routine preventative health maintenance measures emphasized. Please refer to After Visit Summary for other counseling recommendations.    LYNWOOD SOLOMONS, MD Attending Obstetrician & Gynecologist Center for [employer], [facility]
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
## 2024-05-11 LAB — CERVICOVAGINAL ANCILLARY ONLY
Bacterial Vaginitis (gardnerella): NEGATIVE
Candida Glabrata: NEGATIVE
Candida Vaginitis: POSITIVE — AB
Chlamydia: NEGATIVE
Comment: NEGATIVE
Comment: NEGATIVE
Comment: NEGATIVE
Comment: NEGATIVE
Comment: NEGATIVE
Comment: NORMAL
Neisseria Gonorrhea: NEGATIVE
Trichomonas: NEGATIVE

## 2024-05-16 ENCOUNTER — Encounter: Payer: Self-pay | Admitting: Obstetrics & Gynecology

## 2024-05-17 ENCOUNTER — Other Ambulatory Visit: Payer: Self-pay

## 2024-05-17 LAB — CYTOLOGY - PAP
Comment: NEGATIVE
Diagnosis: HIGH — AB
High risk HPV: POSITIVE — AB

## 2024-05-17 MED ORDER — FLUCONAZOLE 150 MG PO TABS: 150.0000 mg | ORAL_TABLET | Freq: Once | ORAL | 0 refills | Status: AC

## 2024-05-21 ENCOUNTER — Other Ambulatory Visit: Payer: Self-pay

## 2024-05-21 MED ORDER — FLUCONAZOLE 150 MG PO TABS: 150.0000 mg | ORAL_TABLET | Freq: Once | ORAL | 0 refills | Status: AC

## 2024-05-22 DIAGNOSIS — N898 Other specified noninflammatory disorders of vagina: Secondary | ICD-10-CM | POA: Diagnosis not present

## 2024-05-22 DIAGNOSIS — Z3202 Encounter for pregnancy test, result negative: Secondary | ICD-10-CM | POA: Diagnosis not present

## 2024-05-24 ENCOUNTER — Other Ambulatory Visit: Payer: Self-pay

## 2024-05-24 MED ORDER — FLUCONAZOLE 150 MG PO TABS: 150.0000 mg | ORAL_TABLET | Freq: Once | ORAL | 0 refills | Status: AC

## 2024-05-25 ENCOUNTER — Ambulatory Visit

## 2024-05-26 ENCOUNTER — Ambulatory Visit: Payer: Self-pay | Admitting: Obstetrics & Gynecology

## 2024-05-26 NOTE — Progress Notes (Signed)
 Please schedule colposcopy for ASC Katrina Cain

## 2024-06-16 ENCOUNTER — Encounter: Admitting: Obstetrics & Gynecology

## 2024-07-06 ENCOUNTER — Encounter: Admitting: Obstetrics & Gynecology

## 2024-07-26 DIAGNOSIS — Z3202 Encounter for pregnancy test, result negative: Secondary | ICD-10-CM | POA: Diagnosis not present

## 2024-07-26 DIAGNOSIS — R0789 Other chest pain: Secondary | ICD-10-CM | POA: Diagnosis not present

## 2024-07-26 NOTE — Progress Notes (Addendum)
 Subjective Patient ID: Katrina Cain is a 28 y.o. female.    Patient here for evaluation of chest pain.  She states it started 07/24/2024.  She reports she felt sharp chest pain that radiated to the left back.  Patient states it has fluctuated since onset, but overall today is better than the first day.  Patient reports pain is worsened with lying down, so she has slept sitting up.  Patient denies cough or wheezing.  She has not been short of breath, but states she can feel some pain with taking a deep breath.  Patient reports history of asthma and acid reflux.  She states she has not used an inhaler in years.  She thought her chest pain could be due to reflux because it started after eating chinese food, but states it did not resolve after taking Nexium.  Patient denies abdominal pain, nausea, or vomiting.     History provided by:  Patient Language interpreter used: No     Review of Systems  Constitutional:  Negative for activity change, chills, diaphoresis and fever.  HENT:  Negative for congestion, ear discharge, ear pain, sore throat and trouble swallowing.   Respiratory:  Negative for cough, shortness of breath and wheezing.   Cardiovascular:  Positive for chest pain. Negative for palpitations and leg swelling.  Gastrointestinal:  Negative for abdominal pain, diarrhea, nausea and vomiting.  Genitourinary:  Negative for difficulty urinating, dysuria, frequency and urgency.  Musculoskeletal:  Negative for arthralgias, back pain, gait problem and neck pain.  Skin:  Negative for rash.  Neurological:  Negative for dizziness and headaches.    Patient History  Allergies: No Known Allergies  Past Medical History:  Diagnosis Date  . Asthma    History reviewed. No pertinent surgical history. Social History   Socioeconomic History  . Marital status: Single    Spouse name: Not on file  . Number of children: Not on file  . Years of education: Not on file  . Highest education level:  Not on file  Occupational History  . Not on file  Tobacco Use  . Smoking status: Every Day    Types: Cigarettes, Cigars  . Smokeless tobacco: Never  Substance and Sexual Activity  . Alcohol use: Yes  . Drug use: Not on file  . Sexual activity: Not on file  Other Topics Concern  . Not on file  Social History Narrative  . Not on file   History reviewed. No pertinent family history. Current Outpatient Medications on File Prior to Visit  Medication Sig Dispense Refill  . Simpesse 0.15-0.03 &0.01 MG tablet tablet Take 1 tablet by mouth 1 (one) time each day.    . cyclobenzaprine  (Flexeril ) 5 MG tablet Take 2 tablets (10 mg total) by mouth 3 (three) times a day if needed for muscle spasms. (Patient not taking: Reported on 07/26/2024) 30 tablet 0  . fluconazole  (Diflucan ) 150 MG tablet Take 1 tab PO, wait 3 days, take 2nd tab (Patient not taking: Reported on 07/26/2024) 2 tablet 0   No current facility-administered medications on file prior to visit.    Objective  Vitals:   07/26/24 0816  BP: 116/79  Pulse: 76  Resp: 20  Temp: 36.8 C (98.2 F)  TempSrc: Oral  SpO2: 100%  Weight: 77.6 kg  Height: 5' 5  PainSc:   8  LMP: 06/25/2024     LMP date is exact.  OBGYN/Pregnancy Status: Having periods      No results found.  Physical Exam  Vitals and nursing note reviewed.  Constitutional:      General: She is not in acute distress.    Appearance: Normal appearance. She is not ill-appearing, toxic-appearing or diaphoretic.  HENT:     Head: Normocephalic and atraumatic.     Mouth/Throat:     Mouth: Mucous membranes are moist.     Pharynx: Oropharynx is clear.  Eyes:     Conjunctiva/sclera: Conjunctivae normal.     Pupils: Pupils are equal, round, and reactive to light.  Cardiovascular:     Rate and Rhythm: Normal rate and regular rhythm.     Pulses: Normal pulses.     Heart sounds: Normal heart sounds.  Pulmonary:     Effort: Pulmonary effort is normal. No  respiratory distress.     Breath sounds: Normal breath sounds. No wheezing, rhonchi or rales.  Musculoskeletal:     Cervical back: Normal range of motion and neck supple.     Thoracic back: No swelling, spasms or tenderness. Normal range of motion.  Skin:    General: Skin is warm.  Neurological:     Mental Status: She is alert.     Results for orders placed or performed in visit on 07/26/24  XR chest 2 views   Narrative   Examination Description: Chest xray, 2  views  Comparisons: None provided.   Findings  Normal size of the cardiac silhouette. No evidence of a pneumothorax. No pleural effusion is suggested. No radiographically apparent consolidation. Unremarkable appearance of the regional osseous structures.    Impression   1. No radiographically apparent acute cardiopulmonary disease.  Electronically signed by: Noal I. Shiela, M.D. on 07/26/2024 10:10:03  POCT pregnancy, urine  Component Result   Preg Test, Ur Negative   Internal Quality Control Pass  ECG 12 lead   Narrative   ECG Interpretation:  Rhythm: Normal sinus rhythm at 69 beats per minute Axis: Normal axis  Intervals: Normal PR interval QRS Complex: Normal ST Segment: Normal ST-T segments QT Interval: Normal  Compared with prior: No, None Available.   Summary of Clinical Condition:  sinus rhythm.  Normal ECG  Interpretation by Eva Rase, PA        Procedures MDM:     1 Acute, uncomplicated illness or injury     Explanation of Medical Decision Making and variances from expected care:  Patient stable, no acute distress.  Vitals normal. ECG normal.  Chest x ray negative.  I do not suspect ACS.  Patient reports she has nearly a full Rx for meloxicam and muscle relaxer from previous urgent care visit, has not used it in months.  Discussed use of the medication for her symptoms.  Follow up precautions given    Risk:: Moderate            Assessment/Plan Diagnoses and all orders for this  visit:  Other chest pain -     ECG 12 lead -     POCT pregnancy, urine -     XR chest 2 views     Disposition Status: Home  Patient Instructions  ECG without abnormal findings  Preliminary review of chest x ray negative.  Will have radiologist review and will contact if there is a discrepancy  We have discussed different possible causes of symptoms Probable musculoskeletal cause We have discussed that you have nearly full prescription for meloxcaim and cyclobenzaprine .  Recommend taking the meloxicam once daily for about 5-7 days.  The cyclobenzaprine  should be primarily taken once at bed time,  but but can take every 8-12 hours as needed if you are not driving or working.  Cyclobenzaprine  can cause drowsiness Seek further evaluation if symptoms worsen or fail to improve   Progress note signed by Eva Rase, PA on 07/26/24 at  9:58 PM

## 2024-07-26 NOTE — Progress Notes (Signed)
 Pt states chest x 2 days pt states chest pain began after eating tried otc meds for heartburn.Pain in lower back and right thigh. Pt states she took ibuprofen  at 6 am 1 pill

## 2024-09-28 ENCOUNTER — Ambulatory Visit: Admitting: Obstetrics

## 2024-10-12 ENCOUNTER — Other Ambulatory Visit: Payer: Self-pay

## 2024-10-12 ENCOUNTER — Emergency Department (HOSPITAL_BASED_OUTPATIENT_CLINIC_OR_DEPARTMENT_OTHER)
Admission: EM | Admit: 2024-10-12 | Discharge: 2024-10-12 | Disposition: A | Discharge status: Home / Self Care | Attending: Emergency Medicine | Admitting: Emergency Medicine

## 2024-10-12 ENCOUNTER — Encounter (HOSPITAL_BASED_OUTPATIENT_CLINIC_OR_DEPARTMENT_OTHER): Payer: Self-pay | Admitting: Emergency Medicine

## 2024-10-12 ENCOUNTER — Emergency Department (HOSPITAL_BASED_OUTPATIENT_CLINIC_OR_DEPARTMENT_OTHER): Admitting: Radiology

## 2024-10-12 DIAGNOSIS — M549 Dorsalgia, unspecified: Secondary | ICD-10-CM | POA: Diagnosis not present

## 2024-10-12 DIAGNOSIS — R202 Paresthesia of skin: Secondary | ICD-10-CM | POA: Diagnosis not present

## 2024-10-12 DIAGNOSIS — R059 Cough, unspecified: Secondary | ICD-10-CM | POA: Diagnosis present

## 2024-10-12 DIAGNOSIS — J101 Influenza due to other identified influenza virus with other respiratory manifestations: Secondary | ICD-10-CM | POA: Insufficient documentation

## 2024-10-12 DIAGNOSIS — J111 Influenza due to unidentified influenza virus with other respiratory manifestations: Secondary | ICD-10-CM

## 2024-10-12 MED ORDER — KETOROLAC TROMETHAMINE 15 MG/ML IJ SOLN
15.0000 mg | Freq: Once | INTRAMUSCULAR | Status: AC
  Administered 2024-10-12: 15 mg via INTRAMUSCULAR
  Filled 2024-10-12: qty 1

## 2024-10-12 MED ORDER — HYDROXYZINE HCL 25 MG PO TABS: 25.0000 mg | ORAL_TABLET | Freq: Four times a day (QID) | ORAL | 0 refills | Status: DC

## 2024-10-12 NOTE — ED Triage Notes (Signed)
 Reports flu like symptoms w/ cough, congestion, and shob x 5 days. NAD.

## 2024-10-12 NOTE — Discharge Instructions (Signed)
 Please read and follow all provided instructions.  Your diagnoses today include:  1. Influenza-like illness   2. Paresthesia     Tests performed today include: Chest x-ray: Does not show pneumonia Vital signs. See below for your results today.   Medications prescribed:  None  Take any prescribed medications only as directed.  Home care instructions:  Follow any educational materials contained in this packet. Please continue drinking plenty of fluids. Use over-the-counter cold and flu medications as needed as directed on packaging for symptom relief. You may also use ibuprofen  or tylenol  as directed on packaging for pain or fever.   BE VERY CAREFUL not to take multiple medicines containing Tylenol  (also called acetaminophen ). Doing so can lead to an overdose which can damage your liver and cause liver failure and possibly death.   Follow-up instructions: Please follow-up with your primary care provider in the next 5 days for further evaluation of your symptoms.   If you continue to have numb sensations after your flu has resolved, please follow-up with your doctor.  Return instructions:  Please return to the Emergency Department if you experience worsening symptoms. Return if you have weakness in your arms or legs, slurred speech, not able to walk or talk, confusion, or trouble with your balance.   Please return if you have a high fever greater than 101 degrees not controlled with over-the-counter medications, persistent vomiting and cannot keep down fluids, or worsening trouble breathing. Please return if you have any other emergent concerns.  Additional Information:  Your vital signs today were: BP (!) 135/112 (BP Location: Right Arm)   Pulse (!) 114   Temp 99.2 F (37.3 C) (Oral)   Resp (!) 22   SpO2 100%  If your blood pressure (BP) was elevated above 135/85 this visit, please have this repeated by your doctor within one month.

## 2024-10-12 NOTE — ED Provider Notes (Signed)
 " [facility] Provider Note   CSN: [id number] Arrival date & time: 10/12/24  0930     Patient presents with: Influenza   Katrina Cain is a 28 y.o. female.   Patient presents to the emergency department today for evaluation of 5 days of flulike symptoms.  Patient states that her daughter just tested positive for flu A.  Patient reports nasal congestion, sore throat, body aches, cough with subjective chills at home.  She states that her body hurts and that she has intermittent numbness from her neck down to her toes that makes it said that she cannot walk.  She denies falls. Patient denies signs of stroke including: facial droop, slurred speech, aphasia, weakness/numbness in extremities, imbalance/trouble walking.  No vomiting or diarrhea reported.  No focal pain, she reports generalized back pain and body pain.  She states that she feels generally weak.        Prior to Admission medications  Medication Sig Start Date End Date Taking? Authorizing Provider  Levonorgestrel -Ethinyl Estradiol (AMETHIA) 0.15-0.03 &0.01 MG tablet Take 1 tablet by mouth daily. 05/10/24   Eveline Lynwood MATSU, MD  naproxen  (NAPROSYN ) 500 MG tablet Take 1 tablet (500 mg total) by mouth every 12 (twelve) hours as needed for mild pain or moderate pain. Patient not taking: Reported on 05/10/2024 03/26/22   Eveline Lynwood MATSU, MD  ondansetron  (ZOFRAN ) 4 MG tablet Take 1 tablet (4 mg total) by mouth every 8 (eight) hours as needed for nausea or vomiting. Patient not taking: Reported on 05/10/2024 10/21/22   Malvina Ellen, MD  Prenat-Fe Poly-Methfol-FA-DHA (VITAFOL  ULTRA) 29-0.6-0.4-200 MG CAPS Take 1 capsule by mouth daily before breakfast. Patient not taking: Reported on 05/10/2024 08/01/21   Rudy Carlin LABOR, MD  promethazine  (PHENERGAN ) 25 MG tablet Take 1 tablet (25 mg total) by mouth every 6 (six) hours as needed for nausea or vomiting. 02/27/19 03/30/20  Keith Sor, PA-C     Allergies: Patient has no known allergies.    Review of Systems  Updated Vital Signs BP (!) 135/112 (BP Location: Right Arm)   Pulse (!) 114   Temp 99.2 F (37.3 C) (Oral)   Resp (!) 22   SpO2 100%   Physical Exam Vitals and nursing note reviewed.  Constitutional:      Appearance: She is well-developed.  HENT:     Head: Normocephalic and atraumatic.     Jaw: No trismus.     Right Ear: Tympanic membrane, ear canal and external ear normal.     Left Ear: Tympanic membrane, ear canal and external ear normal.     Nose: Congestion and rhinorrhea present. No mucosal edema.     Mouth/Throat:     Mouth: Mucous membranes are moist. Mucous membranes are not dry. No oral lesions.     Pharynx: Uvula midline. No oropharyngeal exudate, posterior oropharyngeal erythema or uvula swelling.     Tonsils: No tonsillar abscesses.  Eyes:     General:        Right eye: No discharge.        Left eye: No discharge.     Conjunctiva/sclera: Conjunctivae normal.  Cardiovascular:     Rate and Rhythm: Normal rate and regular rhythm.     Heart sounds: Normal heart sounds.  Pulmonary:     Effort: Pulmonary effort is normal. No respiratory distress.     Breath sounds: Normal breath sounds. No wheezing or rales.  Abdominal:     Palpations: Abdomen is  soft.     Tenderness: There is no abdominal tenderness.  Musculoskeletal:     Cervical back: Normal range of motion and neck supple.  Lymphadenopathy:     Cervical: No cervical adenopathy.  Skin:    General: Skin is warm and dry.  Neurological:     General: No focal deficit present.     Mental Status: She is alert. Mental status is at baseline.     Cranial Nerves: Cranial nerves 2-12 are intact.     Sensory: Sensation is intact.     Motor: Motor function is intact.     Deep Tendon Reflexes: Reflexes normal.     Comments: Patient states up slowly in the chair, can stand and take a few small steps without assistance.  Psychiatric:        Mood and  Affect: Mood normal.     (all labs ordered are listed, but only abnormal results are displayed) Labs Reviewed - No data to display  EKG: None  Radiology: DG Chest 2 View Result Date: 10/12/2024 EXAM: 2 VIEW(S) XRAY OF THE CHEST 10/12/2024 09:55:26 AM COMPARISON: None available. CLINICAL HISTORY: cough FINDINGS: LUNGS AND PLEURA: No focal pulmonary opacity. No pleural effusion. No pneumothorax. HEART AND MEDIASTINUM: No acute abnormality of the cardiac and mediastinal silhouettes. BONES AND SOFT TISSUES: No acute osseous abnormality. Mild thoracolumbar levoscoliosis which may be positional . IMPRESSION: 1. No acute findings. Electronically signed by: Morgane Naveau MD 10/12/2024 11:24 AM EST RP Workstation: [id number]     Procedures   Medications Ordered in the ED  ketorolac  (TORADOL ) 15 MG/ML injection 15 mg (15 mg Intramuscular Given 10/12/24 1124)   ED Course  Patient seen and examined. History obtained directly from patient. Work-up including labs, imaging, EKG ordered in triage, if performed, were reviewed.    Labs/EKG: None ordered  Imaging: None ordered  Medications/Fluids: Ordered IM Toradol   Most recent vital signs reviewed and are as follows: BP (!) 135/112 (BP Location: Right Arm)   Pulse (!) 114   Temp 99.2 F (37.3 C) (Oral)   Resp (!) 22   SpO2 100%   Initial impression: Patient with generalized illness from the flu.  I suspect that she is deconditioned.  Neurologically she seems to be intact.  Is difficult to suspect an acute neurologic emergency with her described numbness, that comes and goes.  I have low concern for a high cervical spine injury or demyelinating process, given waxing and waning symptoms.  She is able to stand and walk at time of my exam.  12:05 PM Reassessment performed. Patient appears stable.  She continues to move well.  No focal weakness.  Imaging personally visualized and interpreted including: Chest x-ray negative  Reviewed  pertinent lab work and imaging with patient at bedside. Questions answered.   Most current vital signs reviewed and are as follows: BP 114/80   Pulse 90   Temp 99.2 F (37.3 C) (Oral)   Resp 18   SpO2 97%   Plan: Discharge to home.   Encouraged to rest and drink plenty of fluids.  Patient told to return to ED or see their primary doctor if their symptoms worsen, high fever not controlled with tylenol , persistent vomiting, they feel they are dehydrated, or if they have any other concerns.   Patient counseled to return if they have weakness in their arms or legs, slurred speech, trouble walking or talking, confusion, trouble with their balance, or if they have any other concerns. Patient verbalizes understanding and agrees  with plan.                                    Medical Decision Making Amount and/or Complexity of Data Reviewed Radiology: ordered.  Risk Prescription drug management.   Patient presents with flulike symptoms.  Testing positive for influenza.  Patient is well-appearing.  Vital signs are stable, fevers controlled.  No clinical signs and symptoms of significant dehydration and is currently tolerating fluids.  Considered secondary infection such as pneumonia, however lungs are clear on exam and patient without hypoxia.  Low concern for sepsis.  In addition, low clinical concern for mononucleosis.   Patient with full body paresthesias.  Not consistent with stroke.  No focal deficits.  She is able to ambulate here without difficulty.  I suspect that she is deconditioned from influenza.  Discussed need for further workup if symptoms do not improve after she feels better from her illness.  Discussed return instructions as above.  Given well appearance, supportive therapy indicated currently.  Discussed signs and symptoms to return with patient at bedside.  Patient seems reliable to return as needed.       Final diagnoses:  Influenza-like illness  Paresthesia     ED Discharge Orders     None          Desiderio Chew, [place] 10/12/24 1206  "

## 2024-11-10 ENCOUNTER — Encounter: Payer: Self-pay | Admitting: Obstetrics & Gynecology

## 2024-12-08 ENCOUNTER — Encounter: Admitting: Obstetrics & Gynecology
